# Patient Record
Sex: Female | Born: 1965 | Race: White | Hispanic: No
Health system: Midwestern US, Community
[De-identification: ages and names within clinical notes are randomized; demographics above are authoritative.]

## PROBLEM LIST (undated history)

## (undated) DIAGNOSIS — M5412 Radiculopathy, cervical region: Secondary | ICD-10-CM

## (undated) DIAGNOSIS — B182 Chronic viral hepatitis C: Secondary | ICD-10-CM

## (undated) DIAGNOSIS — Z1211 Encounter for screening for malignant neoplasm of colon: Secondary | ICD-10-CM

## (undated) DIAGNOSIS — R7989 Other specified abnormal findings of blood chemistry: Secondary | ICD-10-CM

## (undated) DIAGNOSIS — M25559 Pain in unspecified hip: Secondary | ICD-10-CM

## (undated) DIAGNOSIS — T7840XA Allergy, unspecified, initial encounter: Secondary | ICD-10-CM

## (undated) DIAGNOSIS — F319 Bipolar disorder, unspecified: Secondary | ICD-10-CM

## (undated) DIAGNOSIS — M1990 Unspecified osteoarthritis, unspecified site: Secondary | ICD-10-CM

## (undated) DIAGNOSIS — F32A Depression, unspecified: Secondary | ICD-10-CM

## (undated) DIAGNOSIS — F329 Major depressive disorder, single episode, unspecified: Secondary | ICD-10-CM

## (undated) DIAGNOSIS — R51 Headache: Secondary | ICD-10-CM

## (undated) DIAGNOSIS — R519 Headache, unspecified: Secondary | ICD-10-CM

## (undated) HISTORY — DX: Headache: R51

## (undated) HISTORY — DX: Unspecified osteoarthritis, unspecified site: M19.90

## (undated) HISTORY — PX: KNEE ARTHROCENTESIS: SUR44

## (undated) HISTORY — DX: Allergy, unspecified, initial encounter: T78.40XA

## (undated) HISTORY — DX: Headache, unspecified: R51.9

## (undated) HISTORY — DX: Pain in unspecified hip: M25.559

## (undated) HISTORY — PX: WRIST ARTHROCENTESIS: SUR48

---

## 2000-10-13 ENCOUNTER — Emergency Department (HOSPITAL_COMMUNITY): Admission: EM | Admit: 2000-10-13 | Discharge: 2000-10-13 | Payer: Self-pay | Admitting: *Deleted

## 2000-10-14 ENCOUNTER — Encounter: Payer: Self-pay | Admitting: Emergency Medicine

## 2000-11-10 ENCOUNTER — Emergency Department (HOSPITAL_COMMUNITY): Admission: EM | Admit: 2000-11-10 | Discharge: 2000-11-10 | Payer: Self-pay | Admitting: Emergency Medicine

## 2004-05-08 ENCOUNTER — Inpatient Hospital Stay: Payer: Self-pay

## 2004-07-30 ENCOUNTER — Other Ambulatory Visit: Payer: Self-pay

## 2004-07-31 ENCOUNTER — Inpatient Hospital Stay: Payer: Self-pay

## 2004-10-29 ENCOUNTER — Emergency Department: Payer: Self-pay | Admitting: Emergency Medicine

## 2005-03-20 ENCOUNTER — Emergency Department: Payer: Self-pay | Admitting: General Practice

## 2010-12-11 ENCOUNTER — Inpatient Hospital Stay
Admit: 2010-12-11 | Discharge: 2010-12-12 | Disposition: A | Discharge status: Home / Self Care | Payer: Self-pay | Attending: Emergency Medicine

## 2010-12-11 LAB — CARDIAC PANEL,(CK, CKMB & TROPONIN)
CK - MB: 0.6 ng/ml (ref 0.5–3.6)
CK - MB: 0.6 ng/ml (ref 0.5–3.6)
CK-MB Index: 0.9 % (ref 0.0–4.0)
CK-MB Index: 1 % (ref 0.0–4.0)
CK: 68 U/L (ref 26–192)
CK: 58 U/L (ref 26–192)
Troponin-I, QT: <0.04 NG/ML (ref 0.00–0.06)
Troponin-I, QT: <0.04 NG/ML (ref 0.00–0.06)

## 2010-12-11 LAB — URINALYSIS W/ RFLX MICROSCOPIC
Bilirubin: NEGATIVE
Blood: NEGATIVE
Glucose: NEGATIVE MG/DL
Ketone: NEGATIVE MG/DL
Leukocyte Esterase: NEGATIVE
Nitrites: NEGATIVE
Protein: NEGATIVE MG/DL
Specific gravity: 1.025 (ref 1.003–1.030)
Urobilinogen: 1 EU/DL (ref 0.2–1.0)
pH (UA): 6 (ref 5.0–8.0)

## 2010-12-11 LAB — CBC WITH AUTOMATED DIFF
ABS. BASOPHILS: 0 10*3/uL (ref 0.0–0.06)
ABS. EOSINOPHILS: 0.3 10*3/uL (ref 0.0–0.4)
ABS. LYMPHOCYTES: 3 10*3/uL (ref 0.9–3.6)
ABS. MONOCYTES: 1.3 10*3/uL — ABNORMAL HIGH (ref 0.05–1.2)
ABS. NEUTROPHILS: 6.4 10*3/uL (ref 1.8–8.0)
BASOPHILS: 0 % (ref 0–2)
EOSINOPHILS: 2 % (ref 0–5)
HCT: 33.8 % — ABNORMAL LOW (ref 35.0–45.0)
HGB: 11.6 g/dL — ABNORMAL LOW (ref 12.0–16.0)
LYMPHOCYTES: 27 % (ref 21–52)
MCH: 31 PG (ref 24.0–34.0)
MCHC: 34.3 g/dL (ref 31.0–37.0)
MCV: 90.4 FL (ref 74.0–97.0)
MONOCYTES: 12 % — ABNORMAL HIGH (ref 3–10)
MPV: 10.2 FL (ref 9.2–11.8)
NEUTROPHILS: 59 % (ref 40–73)
PLATELET: 239 10*3/uL (ref 135–420)
RBC: 3.74 M/uL — ABNORMAL LOW (ref 4.20–5.30)
RDW: 13.6 % (ref 11.6–14.5)
WBC: 11 10*3/uL (ref 4.6–13.2)

## 2010-12-11 LAB — HEPATIC FUNCTION PANEL
A-G Ratio: 0.9 (ref 0.8–1.7)
ALT (SGPT): 58 U/L (ref 30–65)
AST (SGOT): 23 U/L (ref 15–37)
Albumin: 3 g/dL — ABNORMAL LOW (ref 3.4–5.0)
Alk. phosphatase: 65 U/L (ref 50–136)
Bilirubin, direct: 0.1 MG/DL (ref 0.0–0.2)
Bilirubin, total: 0.3 MG/DL (ref 0.2–1.0)
Globulin: 3.2 g/dL (ref 2.0–4.0)
Protein, total: 6.2 g/dL — ABNORMAL LOW (ref 6.4–8.2)

## 2010-12-11 LAB — METABOLIC PANEL, BASIC
Anion gap: 6 mmol/L (ref 3.0–18)
BUN/Creatinine ratio: 22 — ABNORMAL HIGH (ref 12–20)
BUN: 13 MG/DL (ref 7.0–18)
CO2: 25 MMOL/L (ref 21–32)
Calcium: 6.5 MG/DL — ABNORMAL LOW (ref 8.5–10.1)
Chloride: 112 MMOL/L — ABNORMAL HIGH (ref 100–108)
Creatinine: 0.6 MG/DL (ref 0.6–1.3)
GFR est AA: >60 mL/min/{1.73_m2} (ref >60)
GFR est non-AA: >60 mL/min/{1.73_m2} (ref >60)
Glucose: 78 MG/DL (ref 74–99)
Potassium: 2.7 MMOL/L — CL (ref 3.5–5.5)
Sodium: 143 MMOL/L (ref 136–145)

## 2010-12-11 LAB — ETHYL ALCOHOL: ALCOHOL(ETHYL),SERUM: <3 MG/DL (ref 0–3)

## 2010-12-11 LAB — HCG URINE, QL. - POC: Pregnancy test,urine (POC): NEGATIVE

## 2010-12-11 MED ORDER — HYDROCODONE-ACETAMINOPHEN 5 MG-325 MG TAB
5-325 mg | ORAL | Status: AC
Start: 2010-12-11 — End: 2010-12-11
  Administered 2010-12-11: 23:00:00 via ORAL

## 2010-12-11 MED ORDER — POTASSIUM CHLORIDE SR 20 MEQ TAB, PARTICLES/CRYSTALS
20 mEq | ORAL | Status: AC
Start: 2010-12-11 — End: 2010-12-11
  Administered 2010-12-11: 20:00:00 via ORAL

## 2010-12-11 MED ORDER — POTASSIUM CHLORIDE 10 MEQ/100 ML IV PIGGY BACK
10 mEq/0 mL | INTRAVENOUS | Status: AC
Start: 2010-12-11 — End: 2010-12-11
  Administered 2010-12-11: 21:00:00 via INTRAVENOUS

## 2010-12-11 MED ADMIN — morphine injection 4 mg: INTRAVENOUS | @ 20:00:00 | NDC 00409189001

## 2010-12-11 MED ADMIN — sodium chloride 0.9 % bolus infusion 1,000 mL: INTRAVENOUS | @ 19:00:00 | NDC 00409798309

## 2010-12-11 MED ADMIN — morphine injection 2 mg: INTRAVENOUS | @ 19:00:00 | NDC 00409189001

## 2010-12-11 NOTE — ED Notes (Signed)
I have reviewed discharge instructions with the patient.  The patient verbalized understanding.Patient armband removed and shredded

## 2010-12-11 NOTE — ED Notes (Signed)
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  Pt states that she does not have a heart doctor and does not follow up once she is discharged from the hospital.

## 2010-12-11 NOTE — ED Provider Notes (Signed)
HPI Comments: 45 y.o. female presents to the ED C/O facial injuries s/p falling while intoxicated 2 days ago.  She has dental injury, nasal pain and swelling, abrasions to lower lip and nose, abrasion to right forehead.  Denies neck pain.  She states she thinks she passed out.  She also c/o abrasions and bruising bilateral knees.      Went to [facility] clinic yesterday and was advised to come to ED.  She is concerned because she has been feeling tired and thinks she might be having a heart attack.  Denies chest pain.    PMH MI Jan 2012     Patient is a 45 y.o. female presenting with fall. The history is provided by the patient.   Fall  The accident occurred more than 2 days ago. The fall occurred while walking. She fell from a height of ground level. Impact surface: gravel. The volume of blood lost was minimal (abrasions). The point of impact was the left knee, right knee and head. The pain is present in the head, left knee and right knee. The pain is at a severity of 10/10. The pain is severe. She was ambulatory at the scene. There was alcohol use involved in the accident. Associated symptoms include loss of consciousness. Pertinent negatives include no fever, no abdominal pain, no nausea, no vomiting, no headaches and no laceration. The symptoms are aggravated by activity, ambulation, use of injured limb and pressure on injury.    Written by Tressie Ellis , ED Scribe, as dictated by Harlin Rain, MD.     Past Medical History   Diagnosis Date   ??? Depression    ??? Alcohol abuse    ??? MI (myocardial infarction)      x 4.        No past surgical history on file.      No family history on file.     History     Social History   ??? Marital Status: Single     Spouse Name: N/A     Number of Children: N/A   ??? Years of Education: N/A     Occupational History   ??? Not on file.     Social History Main Topics   ??? Smoking status: Not on file   ??? Smokeless tobacco: Not on file   ??? Alcohol Use: 0.0 oz/week     8-14 Cans of beer per week    ??? Drug Use:    ??? Sexually Active:      Other Topics Concern   ??? Not on file     Social History Narrative   ??? No narrative on file                  ALLERGIES: Review of patient's allergies indicates no known allergies.      Review of Systems   Constitutional: Negative for fever and fatigue.   HENT: Positive for facial swelling. Negative for sore throat, rhinorrhea and neck pain.    Eyes: Negative for visual disturbance.   Respiratory: Negative for cough and shortness of breath.    Cardiovascular: Negative for chest pain and palpitations.   Gastrointestinal: Negative for nausea, vomiting, abdominal pain and diarrhea.   Genitourinary: Negative for dysuria and difficulty urinating.   Musculoskeletal: Positive for gait problem. Negative for myalgias and arthralgias.        B knee pain   Skin: Positive for wound. Negative for color change and rash.   Neurological: Positive  for loss of consciousness and syncope. Negative for light-headedness and headaches.   All other systems reviewed and are negative.    Written by Tressie Ellis , ED Scribe, as dictated by Trevor Mace Ahmon Tosi, MD.     Filed Vitals:    12/11/10 1730 12/11/10 1736 12/11/10 1845 12/11/10 2007   BP: 106/60   105/51   Pulse:  87 85 90   Temp:       Resp:  23 16 18    Height:       Weight:       SpO2:    98%            Physical Exam   Nursing note and vitals reviewed.  Constitutional: She is oriented to person, place, and time. She appears well-developed and well-nourished.   HENT:   Head: Normocephalic. Head is with abrasion and with contusion.       Right Ear: External ear normal.   Left Ear: External ear normal.   Nose: Nose normal. No nasal septal hematoma.   Mouth/Throat: Oropharynx is clear and moist. Abnormal dentition.        Mid face stable  Tooth #7 chipped  Abrasions left forehead, tip of nose and lower lip   Eyes: Conjunctivae and EOM are normal. Pupils are equal, round, and reactive to light.    Neck: Normal range of motion. Neck supple. No JVD present. No tracheal deviation present.   Cardiovascular: Normal rate, regular rhythm, normal heart sounds and intact distal pulses.  Exam reveals no gallop and no friction rub.    No murmur heard.  Pulmonary/Chest: Effort normal and breath sounds normal. No respiratory distress. She has no wheezes. She has no rales.   Abdominal: Soft. Bowel sounds are normal. She exhibits no distension and no mass. There is no tenderness. There is no rebound and no guarding.   Musculoskeletal: Normal range of motion. She exhibits no edema and no tenderness.        Right knee: She exhibits normal range of motion. tenderness found.        Left knee: She exhibits normal range of motion. tenderness ( inferior to patella) found.        Abrasions B knees   Neurological: She is alert and oriented to person, place, and time. She has normal reflexes. No cranial nerve deficit.   Skin: Skin is warm and dry. Abrasion noted. No laceration and no rash noted.        See HENT & musculoskeletal   Psychiatric: She has a normal mood and affect. Her behavior is normal.    Written by Tressie Ellis , ED Scribe, as dictated by Harlin Rain, MD.     MDM     Differential Diagnosis; Clinical Impression; Plan:     DDx:  ICH, fractures, soft tissue injury, ACS, intoxication, dehydration, metabolic abnormality  Amount and/or Complexity of Data Reviewed:   Clinical lab tests:  Ordered and reviewed  Tests in the radiology section of CPT??:  Ordered and reviewed (CT maxillofacial; XR bilat knees; CT head)  Tests in the medicine section of the CPT??:  Ordered and reviewed (EKG)   Review and summarize past medical records:  Yes   Independant visualization of image, tracing, or specimen:  Yes (CT maxillofacial; XR bilat knees; CT head)    EKG FINDING:  Normal Sinus Rhythm; rate: 86; Read by Trevor Mace Brylin Stopper MD at 14:40  Written by Tressie Ellis , ED Scribe, as dictated by Trevor Mace  Nhyira Leano, MD.      XR KNEES BI MIN 4 V (Final result)   Result time:12/11/10 1412      Final result by Rad Results In Edi (12/11/10 14:12:13)      Impression:    Impression:    Negative bilateral knee series      Narrative:    Indication: Pain after fall    Findings: Bilateral knee series, 10 views, including bilateral sunrise views.    No fracture. No lytic or blastic changes. No joint deformity or joint effusion.       CT MAXILLOFACIAL WO CONT (Final result)   Result time:12/11/10 1712      Final result by Rad Results In Edi (12/11/10 17:12:50)      Impression:    Impression:    1. Comminuted, mildly displaced bilateral nasal bone fractures with involvement  of the anterior superior aspect of the nasal septum, as described above.      Narrative:    CT scan of the facial bones without contrast 12/11/2010.    History: Syncope. Perinasal and periorbital soft tissue swelling.    Technique: Serial axial images were obtained from the mentum of the mandible  through the supraorbital ridge without contrast. Sagittal and coronal  reformations were obtained from the source images.    Findings: Mildly comminuted, minimally displaced bilateral nasal bone fractures  are noted. The fracture involves the anterior superior aspect of the nasal  septum which is slightly displaced to the right. The nasomaxillary spine  remains intact. Bone alignment and articulation are otherwise within normal  limits. Specifically, no additional displaced fractures are identified. The  visualized joint spaces are well-maintained. The globes and orbits appear  intact.    Extensive paranasal soft tissue swelling is noted, extending into the malar  regions and also within the periorbital distribution.                    CT HEAD WO CONT (Final result)   Result time:12/11/10 1704      Final result by Rad Results In Edi (12/11/10 17:04:31)      Impression:    IMPRESSION:  1. Unremarkable noncontrast head CT.  2. Recommend MRI as clinically warranted for further evaluation.       Narrative:    CT scan of the head dated 12/11/2010.    HISTORY: Syncope.    TECHNIQUE: Serial axial images were obtained from the foramen magnum to the  skull vertex without IV contrast. No prior studies are available for  comparison.    FINDINGS: The sulci, ventricles, and basal cisterns are within normal limits  for age. There is no intracranial hemorrhage, mass-effect, or midline shift. No  extra-axial fluid collections are identified. There are no significant areas of  abnormal parenchymal attenuation.    The visualized portions of the paranasal sinuses and mastoid air cells are  clear. The visualized bones are intact.           Labs Reviewed   METABOLIC PANEL, BASIC - Abnormal; Notable for the following:     Potassium 2.7 (*)      Chloride 112 (*)      BUN/Creatinine ratio 22 (*)      Calcium 6.5 (*)      All other components within normal limits   CBC WITH AUTOMATED DIFF - Abnormal; Notable for the following:     RBC 3.74 (*)      HGB 11.6 (*)      HCT  33.8 (*)      MONOCYTES 12 (*)      ABS. MONOCYTES 1.3 (*)      All other components within normal limits   HEPATIC FUNCTION PANEL - Abnormal; Notable for the following:     Protein, total 6.2 (*)      Albumin 3.0 (*)      All other components within normal limits   URINALYSIS W/ RFLX MICROSCOPIC   CARDIAC PANEL,(CK, CKMB & TROPONIN)   ETHYL ALCOHOL   HCG URINE, QL. - POC   CARDIAC PANEL,(CK, CKMB & TROPONIN)   POC URINE PREGNANCY TEST       Recent Results (from the past 24 hour(s))   URINALYSIS W/ RFLX MICROSCOPIC    Collection Time    12/11/10 12:30 PM       Component Value Range    Color YELLOW      Appearance CLEAR      Specific gravity 1.025  1.003 - 1.030      pH 6.0  5.0 - 8.0      Protein NEGATIVE   NEGATIVE MG/DL    Glucose NEGATIVE   NEGATIVE MG/DL    Ketone NEGATIVE   NEGATIVE MG/DL    Bilirubin NEGATIVE   NEGATIVE    Blood NEGATIVE   NEGATIVE    Urobilinogen 1.0  0.2 - 1.0 EU/DL    Nitrites NEGATIVE   NEGATIVE     Leukocyte Esterase NEGATIVE   NEGATIVE   EKG, 12 LEAD, INITIAL    Collection Time    12/11/10  2:34 PM       Component Value Range    Ventricular Rate 86      Atrial Rate 86      P-R Interval 124      QRS Duration 66      Q-T Interval 390      QTC Calculation (Bezet) 466      Calculated P Axis 55      Calculated R Axis -39      Calculated T Axis 33      Diagnosis        Value: Normal sinus rhythm      Left axis deviation      Abnormal ECG      No previous ECGs available   METABOLIC PANEL, BASIC    Collection Time    12/11/10  2:35 PM       Component Value Range    Sodium 143  136 - 145 MMOL/L    Potassium 2.7 (*) 3.5 - 5.5 MMOL/L    Chloride 112 (*) 100 - 108 MMOL/L    CO2 25  21 - 32 MMOL/L    Anion gap 6  3.0 - 18 mmol/L    Glucose 78  74 - 99 MG/DL    BUN 13  7.0 - 18 MG/DL    Creatinine 0.6  0.6 - 1.3 MG/DL    BUN/Creatinine ratio 22 (*) 12 - 20      GFR est AA >60  >60 ml/min/1.28m2    GFR est non-AA >60  >60 ml/min/1.53m2    Calcium 6.5 (*) 8.5 - 10.1 MG/DL   CBC WITH AUTOMATED DIFF    Collection Time    12/11/10  2:35 PM       Component Value Range    WBC 11.0  4.6 - 13.2 K/uL    RBC 3.74 (*) 4.20 - 5.30 M/uL    HGB 11.6 (*) 12.0 - 16.0 g/dL  HCT 33.8 (*) 35.0 - 45.0 %    MCV 90.4  74.0 - 97.0 FL    MCH 31.0  24.0 - 34.0 PG    MCHC 34.3  31.0 - 37.0 g/dL    RDW 08.6  57.8 - 46.9 %    PLATELET 239  135 - 420 K/uL    MPV 10.2  9.2 - 11.8 FL    NEUTROPHILS 59  40 - 73 %    LYMPHOCYTES 27  21 - 52 %    MONOCYTES 12 (*) 3 - 10 %    EOSINOPHILS 2  0 - 5 %    BASOPHILS 0  0 - 2 %    ABS. NEUTROPHILS 6.4  1.8 - 8.0 K/UL    ABS. LYMPHOCYTES 3.0  0.9 - 3.6 K/UL    ABS. MONOCYTES 1.3 (*) 0.05 - 1.2 K/UL    ABS. EOSINOPHILS 0.3  0.0 - 0.4 K/UL    ABS. BASOPHILS 0.0  0.0 - 0.06 K/UL    DF AUTOMATED     HCG URINE, QL. - POC    Collection Time    12/11/10  2:53 PM       Component Value Range    Pregnancy test,urine (POC) NEGATIVE   NEGATIVE   CARDIAC PANEL,(CK, CKMB & TROPONIN)    Collection Time    12/11/10  3:00 PM        Component Value Range    CK 68  26 - 192 U/L    CK - MB 0.6  0.5 - 3.6 ng/ml    CK-MB Index 0.9  0.0 - 4.0 %    Troponin-I, Qt. <0.04  0.00 - 0.06 NG/ML   ETHYL ALCOHOL    Collection Time    12/11/10  3:00 PM       Component Value Range    ALCOHOL(ETHYL),SERUM <3  0 - 3 MG/DL   HEPATIC FUNCTION PANEL    Collection Time    12/11/10  6:25 PM       Component Value Range    Protein, total 6.2 (*) 6.4 - 8.2 g/dL    Albumin 3.0 (*) 3.4 - 5.0 g/dL    Globulin 3.2  2.0 - 4.0 g/dL    A-G Ratio 0.9  0.8 - 1.7      Bilirubin, total 0.3  0.2 - 1.0 MG/DL    Bilirubin, direct 0.1  0.0 - 0.2 MG/DL    Alk. phosphatase 65  50 - 136 U/L    AST 23  15 - 37 U/L    ALT 58  30 - 65 U/L   CARDIAC PANEL,(CK, CKMB & TROPONIN)    Collection Time    12/11/10  6:25 PM       Component Value Range    CK 58  26 - 192 U/L    CK - MB 0.6  0.5 - 3.6 ng/ml    CK-MB Index 1.0  0.0 - 4.0 %    Troponin-I, Qt. <0.04  0.00 - 0.06 NG/ML     5:40 PM  Patients results have been reviewed with them.  Patient and/or family have verbally conveyed their understanding and agreement of the patient's signs, symptoms, diagnosis, treatment and prognosis and additionally agree to follow up as recommended or return to the Emergency Room should their condition change prior to their follow-up appointment. Patient verbally agrees with the care-plan and verbally conveys that all of their questions have been answered.   Discharge instructions have also been  provided to the patient with some educational information regarding their diagnosis as well a list of reasons why they would want to return to the ER prior to their follow-up appointment should their condition change.      Procedures      8:17 PM   Patients results have been reviewed with them.  Patient and/or family have verbally conveyed their understanding and agreement of the patient's signs, symptoms, diagnosis, treatment and prognosis and additionally agree to follow up as recommended or return to the Emergency Room should their condition change prior to their follow-up appointment. Patient verbally agrees with the care-plan and verbally conveys that all of their questions have been answered.   Discharge instructions have also been provided to the patient with some educational information regarding their diagnosis as well a list of reasons why they would want to return to the ER prior to their follow-up appointment should their condition change.     CLINICAL IMPRESSION    1. Hypokalemia    2. Nasal fracture    3. Multiple contusions    4. Abrasions of multiple sites

## 2010-12-11 NOTE — ED Notes (Signed)
Pt with nasal swelling, bilat periorbital swelling, abrasion to her lip and both knees, and nose.  Pt states that she chipped both of her top front teeth.  Pt went to [facility] yesterday and was told to go to the emergency room, but went home instead to go to bed.

## 2010-12-11 NOTE — ED Notes (Signed)
Verbal shift change report given to Jenny Hall, RN by Mary Souza, RN.  Report given with ED Summary.

## 2010-12-11 NOTE — ED Notes (Signed)
PT states that she fell.  She states that she blacked out.  PT states that she fell 3 times in a row.  Pt states that she has had a heart attack and she doesn't know if she had another.  PT states that she has been very tired.  Pt states that she was drinking heavily when she passed out.  Pt states that she had consumed 8 beers.

## 2010-12-11 NOTE — ED Notes (Signed)
Pt resting quietly in bed watching TV with visitor at bedside. VSS. Pt provided pain medication at this time, waiting for lab results prior to probable discharge home, verbalized understanding.

## 2010-12-12 LAB — EKG, 12 LEAD, INITIAL
Atrial Rate: 86 {beats}/min
Calculated P Axis: 55 degrees
Calculated R Axis: -39 degrees
Calculated T Axis: 33 degrees
Diagnosis: NORMAL
P-R Interval: 124 ms
Q-T Interval: 390 ms
QRS Duration: 66 ms
QTC Calculation (Bezet): 466 ms
Ventricular Rate: 86 {beats}/min

## 2010-12-12 MED ORDER — HYDROCODONE-ACETAMINOPHEN 5 MG-325 MG TAB
5-325 mg | ORAL_TABLET | ORAL | Status: DC | PRN
Start: 2010-12-12 — End: 2014-04-08

## 2013-12-04 LAB — AMB EXT CREATININE: Creatinine, External: 0.87

## 2013-12-04 LAB — AMB EXT LDL-C: LDL-C, External: 72

## 2014-02-12 ENCOUNTER — Inpatient Hospital Stay: Admit: 2014-02-12 | Payer: MEDICAID | Primary: Family Medicine

## 2014-02-12 ENCOUNTER — Ambulatory Visit
Admit: 2014-02-12 | Discharge: 2014-02-12 | Payer: PRIVATE HEALTH INSURANCE | Attending: Gastroenterology | Primary: Family Medicine

## 2014-02-12 ENCOUNTER — Encounter

## 2014-02-12 DIAGNOSIS — B182 Chronic viral hepatitis C: Secondary | ICD-10-CM

## 2014-02-12 NOTE — Progress Notes (Signed)
[facility]  [facility]       Bryson Ha, MD, FACP, [facility]     April Carollee Massed, NP     Kennon Portela, PA-C      Bryson Ha, MD, FACP, [facility]     Gillian Scarce, MD     Amy Genene Churn, NP     Lillia Pauls, NP        [facility]     [address], [address]     [place], [place]  [address]     [phone]     FAX: [phone]    [facility]     [address], [address]     [place], [place]  [address]     [phone]     FAX: [phone]         Patient Care Team:  Milus Glazier, DO as PCP - General (Family Practice)  None      Problem List  Date Reviewed: Mar 10, 2014        Codes Class Noted    Chronic hepatitis C (HCC) ICD-10-CM:  B18.2  ICD-9-CM:  070.54  03-10-14        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  All medical records sent by the referring physicians were reviewed     The patient is a 48 y.o. Caucasian female who was noted to have abnormalities in liver chemistries and subsequently tested positive for chronic HCV in 2002.      Risk factors for acquiring HCV are IV drug use in 1984 - 2003.  There was no history of acute incteric hepatitis at the time of these risk factors.      Imaging of the liver was not performed.      A liver biopsy has not been performed.        The patient has never received treatment for chronic HCV.      The most recent laboratory studies indicate that the liver transaminases are normal, alkaline phosphatase is normal, tests of hepatic synthetic and metabolic function are normal,     The patient has no symptoms which can be attributed to the liver disorder.    The patient has not experienced fatigue, pain in the right side over the  liver, problems concentrating, swelling of the abdomen, swelling of the lower extremities, hematemesis, hematochezia.    The patient completes all daily activities without any functional limitations.      ALLERGIES  No Known Allergies    MEDICATIONS  Current Outpatient Prescriptions   Medication Sig   ??? BUPROPION HCL (WELLBUTRIN PO) Take  by mouth.     ??? CITALOPRAM HYDROBROMIDE (CELEXA PO) Take  by mouth.     ??? HYDROcodone-acetaminophen (NORCO) 5-325 mg per tablet Take 1 Tab by mouth every four (4) hours as needed for Pain.     No current facility-administered medications for this visit.       SYSTEM REVIEW NOT RELATED TO LIVER DISEASE OR REVIEWED ABOVE:  Constitution systems: Negative for fever, chills, weight gain, weight  loss.   Eyes: Negative for visual changes.  ENT: Negative for sore throat, painful swallowing.   Respiratory: Negative for cough, hemoptysis, SOB.   Cardiology: Negative for chest pain, palpitations.  GI:  Negative for constipation or diarrhea.  GU: Negative for urinary frequency, dysuria, hematuria, nocturia.   Skin: Negative for rash.  Hematology: Negative for easy bruising, blood clots.    Musculo-skelatal: Negative for back pain, muscle pain, weakness.  Neurologic: Negative for headaches, dizziness, vertigo, memory problems not related to HE.  Psychology: Negative for anxiety, depression.       FAMILY HISTORY:  The patient has no knowledge of the father's medical condition.    The mother died of lung cancer.    There is no family history of liver disease.      SOCIAL HISTORY:  The patient is separated.     The spouse has been tested for HCV and is negative.  The patient has 1 child.    The patient currently smokes half pack of tobacco daily.    The patient has never consumed significant amounts of alcohol.    The patient currently works full time lawn care.        PHYSICAL EXAMINATION:  BP 107/76 mmHg   Pulse 90   Temp(Src) 97.6 ??F (36.4 ??C) (Tympanic)   Resp  16   Ht 5\' 10"  (1.778 m)   Wt 156 lb (70.761 kg)   BMI 22.38 kg/m2   SpO2 97%  General: No acute distress.   Eyes: Sclera anicteric.   ENT: No oral lesions.  Thyroid normal.  Nodes: No adenopathy.   Skin: No spider angiomata.  No jaundice.  No palmar erythema.  Respiratory: Lungs clear to auscultation.   Cardiovascular: Regular heart rate.  No murmurs.  No JVD.  Abdomen: Soft non-tender.  Liver size normal to percussion/palpation.  Spleen not palpable. No obvious ascites.  Extremities: No edema.  No muscle wasting.  No gross arthritic changes.  Neurologic: Alert and oriented.  Cranial nerves grossly intact.  No asterixis.    LABORATORY STUDIES:  From 12/2013  AST/ALT/ALP/T Bili/ALB:  40/41/47/0.2/4.1    SEROLOGIES:  12/2013.  HCV RNA Log 6.0 intU    LIVER HISTOLOGY:  Not available or performed    ENDOSCOPIC PROCEDURES:  Not available or performed    RADIOLOGY:  Not available or performed    OTHER TESTING:  Not available or performed    ASSESSMENT AND PLAN:  Chronic HCV of unclear severity.      Liver function is normal.      Will perform laboratory testing to monitor liver function and degree of liver injury.  This included BMP, hepatic panel, CBC with platelet count,     Will perform and/or review results of HCV viral load and HCV genotype to define the specific treatment and duration of treatment that will be required.      Will perform serologic and virologic studies to assess for other causes of chronic liver disease.      Will perform imaging of the liver with ultrasound.      The need to perform an assessment of liver fibrosis was discussed with the patient.  Liver biopsy has been used to assess the degree of fibrosis and the need for HCV treatment.  The risks of performing the liver biopsy including pain, puncture of the lung, gallbladder, intestine or kidney and bleeding were discussed.      The Fibroscan can assess liver fibrosis and determine if a patient has  advanced fibrosis or cirrhosis without the need for liver biopsy.  The Fibroscan is currently available at [facility].  This will be scheduled.    The patient has not previously been treated for HCV.      Discussed the treatment alternatives.  The SVR/cure rate for HCV now exceeds 90% with just oral anti-viral therapy and no interferon injections or significant side effects for most patients with HCV.  The specific treatment is dependent upon genotype, viral load and histology.      Unfortunately most insurance carriers will only provide HCV medication for patients with advanced fibrosis or cirrhosis.  We will request pre-authorization for the HCV medication but approval will be subject to guidelines established by the patient's insurance carrier.  If the patient is denied we may appeal on their behalf.      The patient was directed to continue all current medications at the current dosages.  There are no contraindications for the patient to take any medications that are necessary for treatment of other medical issues.    The patient was counseled regarding alcohol consumption.      The need for vaccination against viral hepatitis A and B will be assessed with serologic and instituted as appropriate.    All of the above issues were discussed with the patient.  All questions were answered.  The patient expressed a clear understanding of the above.    Follow-up [facility] in for Fibroscan, and then to initiate HCV treatment.    Bryson HaMitchell L Laquanna Veazey, MD  [facility]    [id number]  [facility], [address]  [place], [place]  [id number]  [phone]

## 2014-02-17 LAB — CBC WITH AUTOMATED DIFF
ABS. BASOPHILS: 0 10*3/uL (ref 0.0–0.2)
ABS. EOSINOPHILS: 0.3 10*3/uL (ref 0.0–0.4)
ABS. IMM. GRANS.: 0 10*3/uL (ref 0.0–0.1)
ABS. MONOCYTES: 0.5 10*3/uL (ref 0.1–0.9)
ABS. NEUTROPHILS: 3.5 10*3/uL (ref 1.4–7.0)
Abs Lymphocytes: 2.1 10*3/uL (ref 0.7–3.1)
BASOPHILS: 1 %
EOSINOPHILS: 4 %
HCT: 38.7 % (ref 34.0–46.6)
HGB: 12.3 g/dL (ref 11.1–15.9)
IMMATURE GRANULOCYTES: 0 %
Lymphocytes: 33 %
MCH: 29.4 pg (ref 26.6–33.0)
MCHC: 31.8 g/dL (ref 31.5–35.7)
MCV: 92 fL (ref 79–97)
MONOCYTES: 8 %
NEUTROPHILS: 54 %
PLATELET: 257 10*3/uL (ref 150–379)
RBC: 4.19 x10E6/uL (ref 3.77–5.28)
RDW: 14.8 % (ref 12.3–15.4)
WBC: 6.4 10*3/uL (ref 3.4–10.8)

## 2014-02-17 LAB — METABOLIC PANEL, BASIC
BUN/Creatinine ratio: 19 (ref 9–23)
BUN: 18 mg/dL (ref 6–24)
CO2: 25 mmol/L (ref 18–29)
Calcium: 9 mg/dL (ref 8.7–10.2)
Chloride: 104 mmol/L (ref 97–108)
Creatinine: 0.95 mg/dL (ref 0.57–1.00)
GFR est AA: 81 mL/min/{1.73_m2} (ref >59)
GFR est non-AA: 70 mL/min/{1.73_m2} (ref >59)
Glucose: 82 mg/dL (ref 65–99)
Potassium: 4.5 mmol/L (ref 3.5–5.2)
Sodium: 143 mmol/L (ref 134–144)

## 2014-02-17 LAB — HEPATIC FUNCTION PANEL
ALT (SGPT): 66 IU/L — ABNORMAL HIGH (ref 0–32)
AST (SGOT): 46 IU/L — ABNORMAL HIGH (ref 0–40)
Albumin: 4.4 g/dL (ref 3.5–5.5)
Alk. phosphatase: 53 IU/L (ref 39–117)
Bilirubin, direct: 0.08 mg/dL (ref 0.00–0.40)
Bilirubin, total: <0.2 mg/dL (ref 0.0–1.2)
Protein, total: 7.1 g/dL (ref 6.0–8.5)

## 2014-02-17 LAB — HCV REAL-TIME, PCR, QUANT
Hepatitis C Quantitation: 515560 IU/mL
Hepatitis C log 10: 5.712 log10 IU/mL

## 2014-02-17 LAB — HEP C GENOTYPE

## 2014-02-17 LAB — IRON PROFILE
Iron % saturation: 10 % — ABNORMAL LOW (ref 15–55)
Iron: 36 ug/dL (ref 35–155)
TIBC: 354 ug/dL (ref 250–450)
UIBC: 318 ug/dL (ref 150–375)

## 2014-02-17 LAB — HCV RNA BY NAA QL,RFLX TO QT: HCV RNA by NAA, QL: POSITIVE — AB

## 2014-02-17 LAB — HEP B SURFACE AG: Hep B surface Ag screen: NEGATIVE

## 2014-02-17 LAB — HEPATITIS B CORE AB, TOTAL: Hep B Core Ab, total: NEGATIVE

## 2014-02-17 LAB — HEP A AB, TOTAL: Hep A Ab, total: NEGATIVE

## 2014-02-17 LAB — HEP B SURFACE AB: HEP B SURFACE AB, QUAL: NONREACTIVE

## 2014-02-17 LAB — FERRITIN: Ferritin: 90 ng/mL (ref 15–150)

## 2014-02-17 LAB — HEPATITIS B CORE ANTIBODY, TOTAL: Hep B Core Total Ab: NEGATIVE

## 2014-02-17 LAB — HEPATITIS B SURFACE ANTIBODY: HEP B SURFACE AB, QUAL, 006408: NONREACTIVE

## 2014-03-31 ENCOUNTER — Inpatient Hospital Stay: Admit: 2014-03-31 | Payer: MEDICAID | Attending: Gastroenterology | Primary: Family Medicine

## 2014-03-31 DIAGNOSIS — B182 Chronic viral hepatitis C: Secondary | ICD-10-CM

## 2014-04-07 ENCOUNTER — Ambulatory Visit
Admit: 2014-04-07 | Discharge: 2014-04-07 | Payer: PRIVATE HEALTH INSURANCE | Attending: Physician Assistant | Primary: Family Medicine

## 2014-04-07 DIAGNOSIS — B182 Chronic viral hepatitis C: Secondary | ICD-10-CM

## 2014-04-07 NOTE — [place] Notes (Signed)
[place], MD, FACP, [facility]     April G Long, NP     Charlcie Cradle, PA-C      Thedore Mins, MD, [place], [facility]     Marcy Siren, MD     [place], NP     Jaclynn Major, NP        [facility]     [address] [place]  [address]     [phone]     FAX: [facility]     [address] [place]  [address]     [phone]     FAX: [phone]     Patient Care Team:  Rema Fendt, DO as PCP - General (Family Practice)  None      Problem List  Date Reviewed: Apr 22, 2014          Codes Class Noted    Chronic hepatitis C ([place]) ICD-10-CM: B18.2  ICD-9-CM: 070.54  02/12/2014        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  The active problem list, all pertinent past medical history, medications, radiologic findings and laboratory findings related to the liver disorder were reviewed with the patient.      The patient is a 49 y.o. Caucasian female who was noted to have abnormalities in liver chemistries and subsequently tested positive for chronic HCV in 2002.  Risk factors for acquiring HCV are IV drug use in 1984 - 2003.  There was no history of acute incteric hepatitis at the time of these risk factors.      Imaging of the liver was recently performed and shows 2.9cm rounded mass in the posterior right lobe, likely representing hemangioma.    A liver biopsy has not been performed.    She presents instead for fibroscan analysis today.    The patient has never received treatment for chronic HCV.      The most recent laboratory studies indicate that the liver transaminases  are normal, alkaline phosphatase is normal, tests of hepatic synthetic and metabolic function are normal.    The patient has no symptoms which can be attributed to the liver disorder.  The patient has not experienced fatigue, pain in the right side over the liver, problems concentrating, swelling of the abdomen, swelling of the lower extremities, hematemesis, hematochezia. The patient completes all daily activities without any functional limitations.    ALLERGIES  No Known Allergies    MEDICATIONS  Current Outpatient Prescriptions   Medication Sig   ??? methylPREDNISolone (MEDROL DOSEPACK) 4 mg tablet Take  by mouth.   ??? fluticasone (FLONASE) 50 mcg/actuation nasal spray nightly.   ??? HYDROcodone-acetaminophen (NORCO) 5-325 mg per tablet Take 1 Tab by mouth every four (4) hours as needed for Pain.   ??? BUPROPION HCL (WELLBUTRIN PO) Take  by mouth.     ??? CITALOPRAM HYDROBROMIDE (CELEXA PO) Take  by mouth.  No current facility-administered medications for this visit.       SYSTEM REVIEW NOT RELATED TO LIVER DISEASE OR REVIEWED ABOVE:  Constitution systems: Negative for fever, chills, weight gain, weight loss.   Eyes: Negative for visual changes.  ENT: Negative for sore throat, painful swallowing.   Respiratory: Negative for cough, hemoptysis, SOB.   Cardiology: Negative for chest pain, palpitations.  GI:  Negative for constipation or diarrhea.  GU: Negative for urinary frequency, dysuria, hematuria, nocturia.   Skin: Negative for rash.  Hematology: Negative for easy bruising, blood clots.    Musculo-skelatal: Negative for back pain, muscle pain, weakness.  Neurologic: Negative for headaches, dizziness, vertigo, memory problems not related to HE.  Psychology: Negative for anxiety, depression.     FAMILY HISTORY:  The patient has no knowledge of the father's medical condition.    The mother died of lung cancer.    There is no family history of liver disease.      SOCIAL HISTORY:  The patient is separated.      The spouse has been tested for HCV and is negative.  The patient has 1 child.    The patient currently smokes half pack of tobacco daily.    The patient has never consumed significant amounts of alcohol.    The patient currently works full time lawn care.      PHYSICAL EXAMINATION:  BP 120/79 mmHg   Pulse 77   Temp(Src) 96.6 ??F (35.9 ??C) (Oral)   Ht '5\' 10"'  (1.778 m)   Wt 153 lb (69.4 kg)   BMI 21.95 kg/m2   SpO2 98%  General: No acute distress.   Eyes: Sclera anicteric.   ENT: No oral lesions.  Thyroid normal.  Nodes: No adenopathy.   Skin: No spider angiomata.  No jaundice.  No palmar erythema.  Respiratory: Lungs clear to auscultation.   Cardiovascular: Regular heart rate.  No murmurs.  No JVD.  Abdomen: Soft non-tender.  Liver size normal to percussion/palpation.  Spleen not palpable. No obvious ascites.  Extremities: No edema.  No muscle wasting.  No gross arthritic changes.  Neurologic: Alert and oriented.  Cranial nerves grossly intact.  No asterixis.    LABORATORY STUDIES:  [facility] Ref Rng 02/12/2014 12/11/2010   WBC 3.4 - 10.8 x10E3/uL 6.4 11.0   ANC 1.4 - 7.0 x10E3/uL 3.5 6.4   HGB 11.1 - 15.9 g/dL 12.3 11.6 (L)   PLT 150 - 379 x10E3/uL 257 239   AST 0 - 40 IU/L 46 (H) 23   ALT 0 - 32 IU/L 66 (H) 58   Alk Phos 39 - 117 IU/L 53 65   Bili, Total 0.0 - 1.2 mg/dL <0.2 0.3   Bili, Direct 0.00 - 0.40 mg/dL 0.08 0.1   Albumin 3.5 - 5.5 g/dL 4.4 3.0 (L)   BUN 6 - 24 mg/dL 18 13   Creat 0.57 - 1.00 mg/dL 0.95 0.6   Na 134 - 144 mmol/L 143 143   K 3.5 - 5.2 mmol/L 4.5 2.7 (LL)   Cl 97 - 108 mmol/L 104 112 (H)   CO2 18 - 29 mmol/L 25 25   Glucose 65 - 99 mg/dL 82 78     SEROLOGIES:  Serologies Latest Ref Rng 02/12/2014   Hep A Ab, Total Negative Negative   Hep B Surface Ag Negative Negative   Hep B Core Ab, Total Negative Negative   Hep B Surface AB QL  Non Reactive   Hep  C Genotype  1a   HCV RT-PCR, Quant  [id number]   Ferritin 15 - 150 ng/mL 90   Iron % Saturation 15 - 55 % 10 (L)      LIVER HISTOLOGY:  04/2014.  FibroScan performed at [facility]. EkPa was 3.7.  Suggested fibrosis level is F0-1.    ENDOSCOPIC PROCEDURES:  Not available or performed    RADIOLOGY:  03/2014.  Ultrasound of liver.  Echogenic consistent with chronic liver disease.  2.9 cm rounded liver mass, likely hemangioma.  No dilated bile ducts.  No ascites.    OTHER TESTING:  Not available or performed    ASSESSMENT AND PLAN:  Chronic HCV in the setting of no to minimal fibrosis as assessed with fibroscan.    Liver function is normal.      Serologic and virologic studies to assess for other causes of chronic liver disease were negative.    The patient has genotype 1a infection.    The patient has not previously been treated for HCV.      We briefly discussed the treatment alternatives.  The SVR/cure rate for HCV now exceeds 90% with just oral anti-viral therapy and no interferon injections or significant side effects for most patients with HCV.  The specific treatment is dependent upon genotype, viral load and histology.  Given this patient's characteristics, would recommend that she receive treatment for 8 weeks with once daily Harvoni.  Unfortunately most insurance carriers will only provide HCV medication for patients with advanced fibrosis or cirrhosis.  We will request pre-authorization for the HCV medication but approval will be subject to guidelines established by the patient's insurance carrier.  If the patient is denied we may appeal on their behalf.  This will be discussed further at her follow-up with our [facility] office tomorrow.    New possible liver mass on recent screening ultrasound. Finding is likely representing a hemangioma and I have reviewed this at length with the patient.  She understands that she will need CT for further evaluation and that this will be set up through [facility] office.    The patient was directed to continue all current medications at the  current dosages.  There are no contraindications for the patient to take any medications that are necessary for treatment of other medical issues.    The patient was counseled regarding alcohol consumption.      Vaccination for viral hepatitis A and B is recommended since the patient has no serologic evidence of previous exposure or vaccination with immunity.    All of the above issues were discussed with the patient.  All questions were answered.  The patient expressed a clear understanding of the above.    Follow-up [facility] with Telford Nab, NP at the [facility] office.    Charlcie Cradle, PA-C  [facility] [place], [place]  [address]  [phone]

## 2014-04-07 NOTE — Progress Notes (Signed)
Chief Complaint   Patient presents with   ??? Follow-up     Fibroscan

## 2014-04-08 ENCOUNTER — Ambulatory Visit
Admit: 2014-04-08 | Discharge: 2014-04-08 | Payer: PRIVATE HEALTH INSURANCE | Attending: Family | Primary: Family Medicine

## 2014-04-08 DIAGNOSIS — B182 Chronic viral hepatitis C: Secondary | ICD-10-CM

## 2014-04-08 NOTE — [place] Notes (Signed)
[place], MD, FACP, [facility]     Brittney G Long, NP     Charlcie Cradle, PA-C      Thedore Mins, MD, [place], [facility]     Marcy Siren, MD     [place], NP     Jaclynn Major, NP        [facility]     [address], [place]  [address]     [phone]     FAX: [facility]     [address] [place]  [address]     [phone]     FAX: [phone]     Patient Care Team:  Rema Fendt, DO as PCP - General (Family Practice)  None      Problem List  Date Reviewed: April 14, 2014          Codes Class Noted    Chronic hepatitis C [facility]) ICD-10-CM: B18.2  ICD-9-CM: 070.54  02/12/2014        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   The active problem list, all pertinent past medical history, medications, radiologic findings and laboratory findings related to the liver disorder were reviewed with the patient.      The patient is a 49 y.o. Caucasian female who was noted to have abnormalities in liver chemistries and subsequently tested positive for chronic HCV in 2002.  Risk factors for acquiring HCV are IV drug use in 1984 - 2003.  There was no history of acute incteric hepatitis at the time of these risk factors.      Imaging of the liver was recently performed and shows 2.9 cm rounded mass in the posterior right lobe, likely representing hemangioma.    A liver biopsy has not been performed.    Fibroscan analysis was performed on 04/07/2014.  This suggests minimal fibrosis, F0-1.     The patient has never received treatment for chronic HCV.      The most recent laboratory studies indicate that the liver transaminases  are normal, alkaline phosphatase is normal, tests of hepatic synthetic and metabolic function are normal.    The patient has no complaints which can be attributed to liver disease.    The patient denies fatigue. The patient completes all daily activities without any functional limitations. The patient has not experienced fevers, chills, shortness of breath, chest pain, pain in the right side over the liver, diffuse abdominal pain, nausea, vomiting, constipation, diarrhrea, dry eyes, dry mouth, arthralgias, myalgias, yellowing of the eyes or skin, itching, dark urine, problems concentrating, swelling of the abdomen, swelling of the lower extremities, hematemesis, or hematochezia.      ALLERGIES  No Known Allergies    MEDICATIONS  Current Outpatient Prescriptions   Medication Sig   ??? traMADol (ULTRAM) 50 mg tablet Take 50 mg by mouth every six (6) hours as needed for Pain.   ??? fluticasone (FLONASE) 50 mcg/actuation nasal spray nightly.  No current facility-administered medications for this visit.       SYSTEM REVIEW NOT RELATED TO LIVER DISEASE OR REVIEWED ABOVE:  Constitution systems: Negative for fever, chills, weight gain, weight loss.   Eyes: Negative for visual changes.  ENT: Negative for sore throat, painful swallowing.   Respiratory: Negative for cough, hemoptysis, SOB.   Cardiology: Negative for chest pain, palpitations.  GI:  Negative for constipation or diarrhea.  GU: Negative for urinary frequency, dysuria, hematuria, nocturia.   Skin: Negative for rash.  Hematology: Negative for easy bruising, blood clots.    Musculo-skelatal: Negative for back pain, muscle pain, weakness.  Neurologic: Negative for headaches, dizziness, vertigo, memory problems not related to HE.  Psychology: Negative for anxiety, depression.     FAMILY HISTORY:  The patient has no knowledge of the father's medical condition.    The mother died of lung cancer.    There is no family history of liver disease.      SOCIAL HISTORY:   The patient is separated.     The spouse has been tested for HCV and is negative.  The patient has 1 child.    The patient currently smokes half pack of tobacco daily.    The patient has never consumed significant amounts of alcohol.    The patient currently works full time lawn care.      PHYSICAL EXAMINATION:  BP 138/89 mmHg   Pulse 66   Temp(Src) 94.9 ??F (34.9 ??C) (Tympanic)   Resp 16   Ht 5' 10" (1.778 m)   Wt 153 lb 12.8 oz (69.763 kg)   BMI 22.07 kg/m2   SpO2 100%  General: No acute distress.   Eyes: Sclera anicteric.   ENT: No oral lesions.  Thyroid normal.  Nodes: No adenopathy.   Skin: No spider angiomata.  No jaundice.  No palmar erythema.  Respiratory: Lungs clear to auscultation.   Cardiovascular: Regular heart rate.  No murmurs.  No JVD.  Abdomen: Soft non-tender.  Liver size normal to percussion/palpation.  Spleen not palpable. No obvious ascites.  Extremities: No edema.  No muscle wasting.  No gross arthritic changes.  Neurologic: Alert and oriented.  Cranial nerves grossly intact.  No asterixis.    LABORATORY STUDIES:  [facility] Ref Rng 02/12/2014 12/11/2010   WBC 3.4 - 10.8 x10E3/uL 6.4 11.0   ANC 1.4 - 7.0 x10E3/uL 3.5 6.4   HGB 11.1 - 15.9 g/dL 12.3 11.6 (L)   PLT 150 - 379 x10E3/uL 257 239   AST 0 - 40 IU/L 46 (H) 23   ALT 0 - 32 IU/L 66 (H) 58   Alk Phos 39 - 117 IU/L 53 65   Bili, Total 0.0 - 1.2 mg/dL <0.2 0.3   Bili, Direct 0.00 - 0.40 mg/dL 0.08 0.1   Albumin 3.5 - 5.5 g/dL 4.4 3.0 (L)   BUN 6 - 24 mg/dL 18 13   Creat 0.57 - 1.00 mg/dL 0.95 0.6   Na 134 - 144 mmol/L 143 143   K 3.5 - 5.2 mmol/L 4.5 2.7 (LL)   Cl 97 - 108 mmol/L 104 112 (H)   CO2 18 - 29 mmol/L 25 25   Glucose 65 - 99 mg/dL 82 78     SEROLOGIES:  Serologies Latest Ref Rng 02/12/2014   Hep A Ab, Total Negative Negative   Hep B Surface Ag Negative Negative   Hep B Core Ab, Total Negative Negative   Hep B Surface AB QL  Non Reactive   Hep C Genotype  1a   HCV RT-PCR, Quant  [id number]    Ferritin 15 - 150 ng/mL 90   Iron % Saturation 15 - 55 % 10 (L)     LIVER HISTOLOGY:  04/2014.  FibroScan performed at [facility] [place]. EkPa was 3.7.  Suggested fibrosis level is F0-1.    ENDOSCOPIC PROCEDURES:  Not available or performed    RADIOLOGY:  03/2014.  Ultrasound of liver.  Echogenic consistent with chronic liver disease.  2.9 cm rounded liver mass, likely hemangioma.  No dilated bile ducts.  No ascites.    OTHER TESTING:  Not available or performed    ASSESSMENT AND PLAN:  Chronic HCV in the setting of no to minimal fibrosis as assessed with fibroscan.  The most recent laboratory studies indicate that the liver transaminases are normal, alkaline phosphatase is normal, tests of hepatic synthetic and metabolic function are normal, and the platelet count is normal.  Labs were deferred today.     Serologic and virologic studies to assess for other causes of chronic liver disease were negative.    HCV.  The patient has genotype 1A and is treatment naive.  We discussed treatment options. One treatment regime is Harvoni, a combination pill of 400 mg sofosbuvir and 90 mg ledipasvir. The treatment regime is one tablet daily for 8 weeks.  This treatment regime was explained in detail, including dosing and side effects.  She would like to be treated with this regime. This will be ordered.      I explained to her that her insurance carrier may deny coverage of the Harvoni due to this medications extreme cost and her apparent mild disease.     I have explained to her that I will order the medications from the specialty pharmacy and they will be delivered to her home.  She may begin taking the treatment medications as soon as they arrive.  She is to call and make an appointment for treatment week #4 once she begins therapy.  She voiced understanding of this plan.     New possible liver mass on recent screening ultrasound. Finding is likely representing a hemangioma and I have reviewed this at length with the  patient.  She understands that she will need CT for further evaluation.  Triple phase CT was ordered today.     The patient was directed to continue all current medications at the current dosages.  There are no contraindications for the patient to take any medications that are necessary for treatment of other medical issues.    The patient was counseled regarding alcohol consumption.      Vaccination for viral hepatitis A and B is recommended since the patient has no serologic evidence of previous exposure or vaccination with immunity.    All of the above issues were discussed with the patient.  All questions were answered.  The patient expressed a clear understanding of the above.    Alpha in 6 months, earlier if she is approved for HCV treatment    Jaclynn Major, NP   [facility], [place] [address]   [phone]

## 2014-04-15 NOTE — Telephone Encounter (Signed)
Patient has bcbs however is a product of medicaid, therefore she will need drugs/alcohol done to submit to insurance company. Thanks

## 2014-04-22 ENCOUNTER — Other Ambulatory Visit: Payer: MEDICAID | Primary: Family Medicine

## 2014-04-22 ENCOUNTER — Inpatient Hospital Stay: Admit: 2014-04-22 | Primary: Family Medicine

## 2014-04-22 ENCOUNTER — Encounter

## 2014-04-22 NOTE — [place] Notes (Signed)
Labs ordered for medication approval.

## 2014-04-28 ENCOUNTER — Inpatient Hospital Stay: Admit: 2014-04-28 | Primary: Family Medicine

## 2014-05-03 LAB — 7-DRUG SCREEN, WHOLE BLD
Amphetamines: NEGATIVE
Barbiturates: NEGATIVE
Cocaine and metabolites: NEGATIVE
Opiates: NEGATIVE
Oxycodone: NEGATIVE
Phencyclidine: NEGATIVE
THC (Marijuana) metabolite: NEGATIVE

## 2014-05-03 LAB — HEPATIC FUNCTION PANEL
ALT (SGPT): 75 IU/L — ABNORMAL HIGH (ref 0–32)
AST (SGOT): 48 IU/L — ABNORMAL HIGH (ref 0–40)
Albumin: 4.1 g/dL (ref 3.5–5.5)
Alk. phosphatase: 64 IU/L (ref 39–117)
Bilirubin, direct: 0.07 mg/dL (ref 0.00–0.40)
Bilirubin, total: <0.2 mg/dL (ref 0.0–1.2)
Protein, total: 6.6 g/dL (ref 6.0–8.5)

## 2014-05-03 LAB — CBC WITH AUTOMATED DIFF
ABS. BASOPHILS: 0 10*3/uL (ref 0.0–0.2)
ABS. EOSINOPHILS: 0.3 10*3/uL (ref 0.0–0.4)
ABS. IMM. GRANS.: 0 10*3/uL (ref 0.0–0.1)
ABS. MONOCYTES: 0.6 10*3/uL (ref 0.1–0.9)
ABS. NEUTROPHILS: 4 10*3/uL (ref 1.4–7.0)
Abs Lymphocytes: 2.7 10*3/uL (ref 0.7–3.1)
BASOPHILS: 0 %
EOSINOPHILS: 4 %
HCT: 40 % (ref 34.0–46.6)
HGB: 13.5 g/dL (ref 11.1–15.9)
IMMATURE GRANULOCYTES: 0 %
Lymphocytes: 36 %
MCH: 30.2 pg (ref 26.6–33.0)
MCHC: 33.8 g/dL (ref 31.5–35.7)
MCV: 90 fL (ref 79–97)
MONOCYTES: 7 %
NEUTROPHILS: 53 %
PLATELET: 265 10*3/uL (ref 150–379)
RBC: 4.47 x10E6/uL (ref 3.77–5.28)
RDW: 14.5 % (ref 12.3–15.4)
WBC: 7.6 10*3/uL (ref 3.4–10.8)

## 2014-05-03 LAB — METABOLIC PANEL, BASIC
BUN/Creatinine ratio: 22 (ref 9–23)
BUN: 16 mg/dL (ref 6–24)
CO2: 24 mmol/L (ref 18–29)
Calcium: 9 mg/dL (ref 8.7–10.2)
Chloride: 103 mmol/L (ref 97–108)
Creatinine: 0.74 mg/dL (ref 0.57–1.00)
GFR est AA: 108 mL/min/{1.73_m2} (ref >59)
GFR est non-AA: 94 mL/min/{1.73_m2} (ref >59)
Glucose: 62 mg/dL — ABNORMAL LOW (ref 65–99)
Potassium: 4.1 mmol/L (ref 3.5–5.2)
Sodium: 143 mmol/L (ref 134–144)

## 2014-05-03 LAB — ETHYL ALCOHOL: Ethanol: NEGATIVE %

## 2014-05-06 ENCOUNTER — Inpatient Hospital Stay: Admit: 2014-05-06 | Payer: MEDICAID | Primary: Family Medicine

## 2014-05-06 DIAGNOSIS — N281 Cyst of kidney, acquired: Secondary | ICD-10-CM

## 2014-05-06 MED ORDER — IOPAMIDOL 61 % IV SOLN
300 mg iodine /mL (61 %) | Freq: Once | INTRAVENOUS | Status: AC
Start: 2014-05-06 — End: 2014-05-06
  Administered 2014-05-06: 13:00:00 via INTRAVENOUS

## 2014-05-06 MED ORDER — BARIUM SULFATE 2 % ORAL SUSP
2.1 % (w/v), 2.0 % (w/w) | Freq: Once | ORAL | Status: AC
Start: 2014-05-06 — End: 2014-05-06
  Administered 2014-05-06: 13:00:00 via ORAL

## 2014-05-06 MED FILL — ISOVUE-300  61 % INTRAVENOUS SOLUTION: 300 mg iodine /mL (61 %) | INTRAVENOUS | Qty: 100

## 2014-05-06 MED FILL — READI-CAT 2  2.1 % (W/V), 2.0 % (W/W) ORAL SUSPENSION: 2.1 % (w/v), 2.0 % (w/w) | ORAL | Qty: 900

## 2014-05-12 NOTE — Telephone Encounter (Signed)
Updated on recent ultrasound. No hepatic masses.  Follow up as scheduled.

## 2014-09-02 ENCOUNTER — Ambulatory Visit (INDEPENDENT_AMBULATORY_CARE_PROVIDER_SITE_OTHER): Payer: Medicaid Other | Admitting: Family Medicine

## 2014-09-02 ENCOUNTER — Encounter: Payer: Self-pay | Admitting: Family Medicine

## 2014-09-02 VITALS — BP 136/82 | HR 69 | Temp 98.7°F | Resp 16 | Ht 70.0 in | Wt 142.6 lb

## 2014-09-02 DIAGNOSIS — Z8249 Family history of ischemic heart disease and other diseases of the circulatory system: Secondary | ICD-10-CM | POA: Diagnosis not present

## 2014-09-02 DIAGNOSIS — F1190 Opioid use, unspecified, uncomplicated: Secondary | ICD-10-CM | POA: Diagnosis not present

## 2014-09-02 DIAGNOSIS — B1920 Unspecified viral hepatitis C without hepatic coma: Secondary | ICD-10-CM | POA: Insufficient documentation

## 2014-09-02 DIAGNOSIS — Z1211 Encounter for screening for malignant neoplasm of colon: Secondary | ICD-10-CM | POA: Diagnosis not present

## 2014-09-02 DIAGNOSIS — F1110 Opioid abuse, uncomplicated: Secondary | ICD-10-CM

## 2014-09-02 DIAGNOSIS — M1990 Unspecified osteoarthritis, unspecified site: Secondary | ICD-10-CM | POA: Diagnosis not present

## 2014-09-02 DIAGNOSIS — M1612 Unilateral primary osteoarthritis, left hip: Secondary | ICD-10-CM

## 2014-09-02 DIAGNOSIS — B182 Chronic viral hepatitis C: Secondary | ICD-10-CM | POA: Diagnosis not present

## 2014-09-02 DIAGNOSIS — Z720 Tobacco use: Secondary | ICD-10-CM | POA: Diagnosis not present

## 2014-09-02 DIAGNOSIS — J3081 Allergic rhinitis due to animal (cat) (dog) hair and dander: Secondary | ICD-10-CM

## 2014-09-02 DIAGNOSIS — J309 Allergic rhinitis, unspecified: Secondary | ICD-10-CM | POA: Insufficient documentation

## 2014-09-02 MED ORDER — FLUTICASONE PROPIONATE 50 MCG/ACT NA SUSP: 2.0000 | Freq: Every day | NASAL | Status: AC

## 2014-09-02 MED ORDER — HYDROCODONE-ACETAMINOPHEN 5-325 MG PO TABS
1.0000 | ORAL_TABLET | Freq: Every day | ORAL | Status: DC
Start: 2014-09-02 — End: 2015-12-14

## 2014-09-02 MED ORDER — MELOXICAM 15 MG PO TABS: 15.0000 mg | ORAL_TABLET | Freq: Every day | ORAL | Status: DC

## 2014-09-02 MED ORDER — BUPROPION HCL ER (SR) 150 MG PO TB12: 150.0000 mg | ORAL_TABLET | Freq: Two times a day (BID) | ORAL | Status: AC

## 2014-09-02 MED ORDER — LORATADINE 10 MG PO TABS: 10.0000 mg | ORAL_TABLET | Freq: Every day | ORAL | Status: AC

## 2014-09-02 NOTE — Assessment & Plan Note (Signed)
Start Mobic for pain. Renewed 1 vicodin daily for pain as needed. Discussed need for orthopedic intervention and possible hip replacement. Pt referred to orthopedics today for evaluation.

## 2014-09-02 NOTE — Assessment & Plan Note (Signed)
Wellbutrin restarted for smoking cessation. [facility] Smoking cessation materials given. Follow-up 6 weeks for efficacy. Goal to stop smoking in 3 mos.

## 2014-09-02 NOTE — Progress Notes (Signed)
Subjective:    Patient ID: Heather Rangel, female    DOB: 08-Aug-1965, 49 y.o.   MRN: [id number]  HPI: Heather Rangel is a 49 y.o. female presenting on 09/02/2014 for Establish Care; Arthritis; and Allergic Rhinitis    HPI  Pt presents to establish care today. Previous care provider was Dr Mel[facility]arkin [facility]. :  Current medical problems include: Degenerative disc disease/ Left hip arthritis: Pt has septic hip 10 years ago. Was told she needed hip replacement. Was recently told by orthopedist that she has degenerative disc diease. Last seen by ortho 3 mos ago was scheduled for SI joint injection but missed due to family reasons. Smoking cessation: Pt is trying to quit and has been taking well butrin. Seasonal allergies: Taking flonase OTC but still having allergies symptoms Hepatits C: [facility] [place]. Was denied medication by insurance- in appeal. Pt will need local GI.  Health Maintenance: Has not had colonoscopy.  Pap smear 2013: Normal. Unsure if HPV co-testing. Smoking- 1/2 pack per day. Reduced when using wellbutrin. Works in [employer]- very active outside daily. Occasional sunscreen use.   Past Medical History  Diagnosis Date  . Allergy   . Arthritis   . Headache   . Hip pain    History   Social History  . Marital Status: Single    Spouse Name: N/A  . Number of Children: N/A  . Years of Education: N/A   Occupational History  . Not on file.   Social History Main Topics  . Smoking status: Light Tobacco Smoker  . Smokeless tobacco: Not on file  . Alcohol Use: Yes  . Drug Use: No  . Sexual Activity: Not on file   Other Topics Concern  . Not on file   Social History Narrative  . No narrative on file   Family History  Problem Relation Age of Onset  . Cancer Mother     lung cancer  . Heart disease Father   . Alcohol abuse Father   . Cancer Paternal Grandmother   . Cancer Paternal  Grandfather    No current outpatient prescriptions on file prior to visit.   No current facility-administered medications on file prior to visit.    Review of Systems Per HPI unless specifically indicated above     Objective:    BP 136/82 mmHg  Pulse 69  Temp(Src) 98.7 F (37.1 C) (Oral)  Resp 16  Ht 5\' 10"  (1.778 m)  Wt 142 lb 9.6 oz (64.683 kg)  BMI 20.46 kg/m2  LMP   Wt Readings from Last 3 Encounters:  09/02/14 142 lb 9.6 oz (64.683 kg)    Physical Exam  Constitutional: She is oriented to person, place, and time. She appears well-developed and well-nourished. No distress.  Neck: Normal range of motion. Neck supple. No thyromegaly present.  Cardiovascular: Normal rate and regular rhythm.  Exam reveals no gallop and no friction rub.   No murmur heard. Pulmonary/Chest: Effort normal and breath sounds normal.  Abdominal: Soft. Bowel sounds are normal. There is no tenderness. There is no rebound.  Musculoskeletal: Normal range of motion. She exhibits no edema or tenderness.  Lymphadenopathy:    She has no cervical adenopathy.  Neurological: She is alert and oriented to person, place, and time.  Skin: Skin is warm and dry. She is not diaphoretic.   No results found for this or any previous visit.  Assessment & Plan:   Problem List Items Addressed This Visit      Respiratory   Allergic rhinitis    Due to pet dander. Discussed hepafilters and pillow protectors. Keep pets out of room. Renew Flonase and Claritin.       Relevant Orders   Comprehensive Metabolic Panel (CMET)     Digestive   Hepatitis C    Pt has not been treated due to insurance denial, her appeal is pending. Refer to gastroenterology today for evaluation and treatment of Hep C.       Relevant Orders   Hepatitis C Antibody   Ambulatory referral to General Surgery     Musculoskeletal and Integument   Arthritis of left hip - Primary    Start Mobic for pain. Renewed 1 vicodin daily for pain as  needed. Discussed need for orthopedic intervention and possible hip replacement. Pt referred to orthopedics today for evaluation.        Relevant Medications   meloxicam (MOBIC) 15 MG tablet   HYDROcodone-acetaminophen (NORCO/VICODIN) 5-325 MG per tablet   Other Relevant Orders   Ambulatory referral to Orthopedic Surgery   Drug Screen, Urine     Other   Tobacco use    Wellbutrin restarted for smoking cessation. Pt referred to [facility] Quitline. Smoking cessation materials given. Follow-up 6 weeks for efficacy. Goal to stop smoking in 3 mos.       Relevant Medications   buPROPion (WELLBUTRIN SR) 150 MG 12 hr tablet    Other Visit Diagnoses    Family history of heart disease        Relevant Orders    Lipid Profile    Colon cancer screening        Refer to GI to set up screening colonscopy.     Relevant Orders    Ambulatory referral to General Surgery    Opioid use disorder, mild, on maintenance therapy        Pt on maintenance Norco for arthritis. Aware this office does not prescribe longterm narcotics and no refills on today's prescription. Urine drug screen ordered    Relevant Orders    Drug Screen, Urine       Meds ordered this encounter  Medications  . DISCONTD: fluticasone (FLONASE) 50 MCG/ACT nasal spray    Sig: Place into both nostrils daily.  Marland Kitchen DISCONTD: HYDROcodone-acetaminophen (NORCO/VICODIN) 5-325 MG per tablet    Sig: Take 1 tablet by mouth every 6 (six) hours as needed for moderate pain.  Marland Kitchen DISCONTD: buPROPion (WELLBUTRIN) 100 MG tablet    Sig: Take 100 mg by mouth 2 (two) times daily.  Marland Kitchen buPROPion (WELLBUTRIN SR) 150 MG 12 hr tablet    Sig: Take 1 tablet (150 mg total) by mouth 2 (two) times daily. Take 150 mg once daily at bedtime for 3 days. Then take 150mg  twice daily.    Dispense:  30 tablet    Refill:  2    Order Specific Question:  Supervising Provider    Answer:  Janeann Forehand [phone]  . meloxicam (MOBIC) 15 MG tablet    Sig: Take 1 tablet (15 mg  total) by mouth daily.    Dispense:  30 tablet    Refill:  1    Order Specific Question:  Supervising Provider    Answer:  Janeann Forehand [phone]  . HYDROcodone-acetaminophen (NORCO/VICODIN) 5-325 MG per tablet    Sig: Take 1 tablet by mouth at bedtime.    Dispense:  30  tablet    Refill:  0    Order Specific Question:  Supervising Provider    Answer:  Janeann Forehand [[id number]]  . fluticasone (FLONASE) 50 MCG/ACT nasal spray    Sig: Place 2 sprays into both nostrils daily.    Dispense:  16 g    Refill:  11    Order Specific Question:  Supervising Provider    Answer:  Janeann Forehand [phone]  . loratadine (CLARITIN) 10 MG tablet    Sig: Take 1 tablet (10 mg total) by mouth daily.    Dispense:  30 tablet    Refill:  11    Order Specific Question:  Supervising Provider    Answer:  Janeann Forehand [phone]      Follow up plan: Return in about 6 weeks (around 10/14/2014).

## 2014-09-02 NOTE — Patient Instructions (Addendum)
Hip Pain: We will refer you to orthopedics for your hip pain for management. I will cover your Vicodin until you are able to see the orthopedist and they can take over your pain management.   Seasonal allergies: Restart flonase. Take claritin daily.  Smoking cessation: Well butrin  at bedtime for the first 3 days then resume taking twice daily. Try [facility] Quitline.   Bupropion sustained-release tablets (smoking cessation) What is this medicine? BUPROPION (byoo PROE pee on) is used to help people quit smoking. This medicine may be used for other purposes; ask your health care provider or pharmacist if you have questions. COMMON BRAND NAME(S): Buproban, Zyban What should I tell my health care provider before I take this medicine? They need to know if you have any of these conditions: -an eating disorder, such as anorexia or bulimia -bipolar disorder or psychosis -diabetes or high blood sugar, treated with medication -glaucoma -head injury or brain tumor -heart disease, previous heart attack, or irregular heart beat -high blood pressure -kidney or liver disease -seizures -suicidal thoughts or a previous suicide attempt -Tourette's syndrome -weight loss -an unusual or allergic reaction to bupropion, other medicines, foods, dyes, or preservatives -breast-feeding -pregnant or trying to become pregnant How should I use this medicine? Take this medicine by mouth with a glass of water. Follow the directions on the prescription label. You can take it with or without food. If it upsets your stomach, take it with food. Do not cut, crush or chew this medicine. Take your medicine at regular intervals. If you take this medicine more than once a day, take your second dose at least 8 hours after you take your first dose. To limit difficulty in sleeping, avoid taking this medicine at bedtime. Do not take your medicine more often than directed. Do not stop taking this medicine suddenly except upon the  advice of your doctor. Stopping this medicine too quickly may cause serious side effects. A special MedGuide will be given to you by the pharmacist with each prescription and refill. Be sure to read this information carefully each time. Talk to your pediatrician regarding the use of this medicine in children. Special care may be needed. Overdosage: If you think you have taken too much of this medicine contact a poison control center or emergency room at once. NOTE: This medicine is only for you. Do not share this medicine with others. What if I miss a dose? If you miss a dose, skip the missed dose and take your next tablet at the regular time. There should be at least 8 hours between doses. Do not take double or extra doses. What may interact with this medicine? Do not take this medicine with any of the following medications: -linezolid -MAOIs like Azilect, Carbex, Eldepryl, Marplan, Nardil, and Parnate -methylene blue (injected into a vein) -other medicines that contain bupropion like Wellbutrin This medicine may also interact with the following medications: -alcohol -certain medicines for anxiety or sleep -certain medicines for blood pressure like metoprolol, propranolol -certain medicines for depression or psychotic disturbances -certain medicines for HIV or AIDS like efavirenz, lopinavir, nelfinavir, ritonavir -certain medicines for irregular heart beat like propafenone, flecainide -certain medicines for Parkinson's disease like amantadine, levodopa -certain medicines for seizures like carbamazepine, phenytoin, phenobarbital -cimetidine -clopidogrel -cyclophosphamide -furazolidone -isoniazid -nicotine -orphenadrine -procarbazine -steroid medicines like prednisone or cortisone -stimulant medicines for attention disorders, weight loss, or to stay awake -tamoxifen -theophylline -thiotepa -ticlopidine -tramadol -warfarin This list may not describe all possible interactions. Give  your  health care provider a list of all the medicines, herbs, non-prescription drugs, or dietary supplements you use. Also tell them if you smoke, drink alcohol, or use illegal drugs. Some items may interact with your medicine. What should I watch for while using this medicine? Visit your doctor or health care professional for regular checks on your progress. This medicine should be used together with a patient support program. It is important to participate in a behavioral program, counseling, or other support program that is recommended by your health care professional. Patients and their families should watch out for new or worsening thoughts of suicide or depression. Also watch out for sudden changes in feelings such as feeling anxious, agitated, panicky, irritable, hostile, aggressive, impulsive, severely restless, overly excited and hyperactive, or not being able to sleep. If this happens, especially at the beginning of treatment or after a change in dose, call your health care professional. Avoid alcoholic drinks while taking this medicine. Drinking excessive alcoholic beverages, using sleeping or anxiety medicines, or quickly stopping the use of these agents while taking this medicine may increase your risk for a seizure. Do not drive or use heavy machinery until you know how this medicine affects you. This medicine can impair your ability to perform these tasks. Do not take this medicine close to bedtime. It may prevent you from sleeping. Your mouth may get dry. Chewing sugarless gum or sucking hard candy, and drinking plenty of water may help. Contact your doctor if the problem does not go away or is severe. Do not use nicotine patches or chewing gum without the advice of your doctor or health care professional while taking this medicine. You may need to have your blood pressure taken regularly if your doctor recommends that you use both nicotine and this medicine together. What side effects may I  notice from receiving this medicine? Side effects that you should report to your doctor or health care professional as soon as possible: -allergic reactions like skin rash, itching or hives, swelling of the face, lips, or tongue -breathing problems -changes in vision -confusion -fast or irregular heartbeat -hallucinations -increased blood pressure -redness, blistering, peeling or loosening of the skin, including inside the mouth -seizures -suicidal thoughts or other mood changes -unusually weak or tired -vomiting Side effects that usually do not require medical attention (report to your doctor or health care professional if they continue or are bothersome): -change in sex drive or performance -constipation -headache -loss of appetite -nausea -tremors -weight loss This list may not describe all possible side effects. Call your doctor for medical advice about side effects. You may report side effects to FDA at 1-800-FDA-1088. Where should I keep my medicine? Keep out of the reach of children. Store at room temperature between 20 and 25 degrees C (68 and 77 degrees F). Protect from light. Keep container tightly closed. Throw away any unused medicine after the expiration date. NOTE: This sheet is a summary. It may not cover all possible information. If you have questions about this medicine, talk to your doctor, pharmacist, or health care provider.  2015, Elsevier/Gold Standard. (2012-10-19 10:55:10)

## 2014-09-02 NOTE — Assessment & Plan Note (Signed)
Pt has not been treated due to insurance denial, her appeal is pending. Refer to gastroenterology today for evaluation and treatment of Hep C.

## 2014-09-02 NOTE — Assessment & Plan Note (Signed)
Due to pet dander. Discussed hepafilters and pillow protectors. Keep pets out of room. Renew Flonase and Claritin.

## 2014-09-11 ENCOUNTER — Telehealth: Payer: Self-pay | Admitting: Family Medicine

## 2014-09-11 NOTE — Telephone Encounter (Signed)
Pt has appointment on 09/18/2014 at 10:00 am with Dr. Katrinka BlazingSmith, Cristal Deerhristopher. LMTCB

## 2014-09-12 NOTE — Telephone Encounter (Signed)
Called  twice and finally letter was send.   Heather Rangel

## 2014-09-15 ENCOUNTER — Ambulatory Visit: Payer: Medicaid Other | Admitting: Gastroenterology

## 2014-10-06 ENCOUNTER — Ambulatory Visit: Payer: Medicaid Other | Admitting: Family Medicine

## 2014-10-07 ENCOUNTER — Encounter: Attending: Family | Primary: Family Medicine

## 2014-10-20 ENCOUNTER — Emergency Department: Payer: Self-pay

## 2014-10-20 ENCOUNTER — Emergency Department
Admission: EM | Admit: 2014-10-20 | Discharge: 2014-10-21 | Disposition: A | Discharge status: Home / Self Care | Payer: Self-pay | Attending: Emergency Medicine | Admitting: Emergency Medicine

## 2014-10-20 DIAGNOSIS — F1110 Opioid abuse, uncomplicated: Secondary | ICD-10-CM | POA: Insufficient documentation

## 2014-10-20 DIAGNOSIS — F1410 Cocaine abuse, uncomplicated: Secondary | ICD-10-CM | POA: Insufficient documentation

## 2014-10-20 DIAGNOSIS — W1839XA Other fall on same level, initial encounter: Secondary | ICD-10-CM | POA: Insufficient documentation

## 2014-10-20 DIAGNOSIS — N179 Acute kidney failure, unspecified: Secondary | ICD-10-CM | POA: Insufficient documentation

## 2014-10-20 DIAGNOSIS — Z720 Tobacco use: Secondary | ICD-10-CM | POA: Insufficient documentation

## 2014-10-20 DIAGNOSIS — Y998 Other external cause status: Secondary | ICD-10-CM | POA: Insufficient documentation

## 2014-10-20 DIAGNOSIS — Y9389 Activity, other specified: Secondary | ICD-10-CM | POA: Insufficient documentation

## 2014-10-20 DIAGNOSIS — Z791 Long term (current) use of non-steroidal anti-inflammatories (NSAID): Secondary | ICD-10-CM | POA: Insufficient documentation

## 2014-10-20 DIAGNOSIS — Y9289 Other specified places as the place of occurrence of the external cause: Secondary | ICD-10-CM | POA: Insufficient documentation

## 2014-10-20 DIAGNOSIS — F1910 Other psychoactive substance abuse, uncomplicated: Secondary | ICD-10-CM

## 2014-10-20 DIAGNOSIS — T148 Other injury of unspecified body region: Secondary | ICD-10-CM | POA: Insufficient documentation

## 2014-10-20 DIAGNOSIS — Z79899 Other long term (current) drug therapy: Secondary | ICD-10-CM | POA: Insufficient documentation

## 2014-10-20 DIAGNOSIS — Z7951 Long term (current) use of inhaled steroids: Secondary | ICD-10-CM | POA: Insufficient documentation

## 2014-10-20 DIAGNOSIS — F319 Bipolar disorder, unspecified: Secondary | ICD-10-CM | POA: Insufficient documentation

## 2014-10-20 HISTORY — DX: Depression, unspecified: F32.A

## 2014-10-20 HISTORY — DX: Bipolar disorder, unspecified: F31.9

## 2014-10-20 HISTORY — DX: Major depressive disorder, single episode, unspecified: F32.9

## 2014-10-20 LAB — COMPREHENSIVE METABOLIC PANEL
ALBUMIN: 5.2 g/dL — AB (ref 3.5–5.0)
ALT: 73 U/L — ABNORMAL HIGH (ref 14–54)
ANION GAP: 16 — AB (ref 5–15)
AST: 61 U/L — ABNORMAL HIGH (ref 15–41)
Alkaline Phosphatase: 55 U/L (ref 38–126)
BILIRUBIN TOTAL: 0.8 mg/dL (ref 0.3–1.2)
BUN: 40 mg/dL — ABNORMAL HIGH (ref 6–20)
CO2: 22 mmol/L (ref 22–32)
Calcium: 10.3 mg/dL (ref 8.9–10.3)
Chloride: 94 mmol/L — ABNORMAL LOW (ref 101–111)
Creatinine, Ser: 2.18 mg/dL — ABNORMAL HIGH (ref 0.44–1.00)
GFR calc non Af Amer: 25 mL/min — ABNORMAL LOW (ref >60)
GFR, EST AFRICAN AMERICAN: 29 mL/min — AB (ref >60)
GLUCOSE: 151 mg/dL — AB (ref 65–99)
POTASSIUM: 4 mmol/L (ref 3.5–5.1)
SODIUM: 132 mmol/L — AB (ref 135–145)
TOTAL PROTEIN: 8.6 g/dL — AB (ref 6.5–8.1)

## 2014-10-20 LAB — LIPASE, BLOOD: LIPASE: 22 U/L (ref 22–51)

## 2014-10-20 LAB — CBC
HEMATOCRIT: 43.8 % (ref 35.0–47.0)
HEMOGLOBIN: 14.5 g/dL (ref 12.0–16.0)
MCH: 28.2 pg (ref 26.0–34.0)
MCHC: 33.2 g/dL (ref 32.0–36.0)
MCV: 85.1 fL (ref 80.0–100.0)
Platelets: 225 10*3/uL (ref 150–440)
RBC: 5.15 MIL/uL (ref 3.80–5.20)
RDW: 13.5 % (ref 11.5–14.5)
WBC: 15.3 10*3/uL — ABNORMAL HIGH (ref 3.6–11.0)

## 2014-10-20 MED ORDER — SODIUM CHLORIDE 0.9 % IV BOLUS (SEPSIS)
1000.0000 mL | Freq: Once | INTRAVENOUS | Status: AC
  Administered 2014-10-20: 1000 mL via INTRAVENOUS

## 2014-10-20 MED ORDER — LORAZEPAM 2 MG/ML IJ SOLN
1.0000 mg | Freq: Once | INTRAMUSCULAR | Status: AC
  Administered 2014-10-20: 1 mg via INTRAVENOUS

## 2014-10-20 MED ORDER — LORAZEPAM 2 MG/ML IJ SOLN
1.0000 mg | Freq: Once | INTRAMUSCULAR | Status: AC
  Administered 2014-10-20: 1 mg via INTRAVENOUS
  Filled 2014-10-20: qty 1

## 2014-10-20 MED ORDER — LORAZEPAM 2 MG/ML IJ SOLN
INTRAMUSCULAR | Status: AC
  Administered 2014-10-20: 1 mg via INTRAVENOUS
  Filled 2014-10-20: qty 1

## 2014-10-20 NOTE — BH Specialist Note (Signed)
TTS attempted to assess Heather Rangel. TTS unable to arouse Heather Rangel at this time to complete TTS assessment

## 2014-10-20 NOTE — ED Notes (Signed)

## 2014-10-20 NOTE — ED Notes (Signed)
Pt awoke and is notably anxious, unable to sit still, and requests to leave ER medical facility. Will complete COWS accordingly.MD made aware. MD verbalized orders. Will complete accordingly.

## 2014-10-20 NOTE — ED Notes (Signed)
Pt comes into the ED via EMS from home.the patient states after leaving her probation officer today she went home and self injected  oxycotin and did 1 hit of crack cocaine.the patient c/o having chest pain and generalized abd pain.the patient states she is trying to self medicate for her bipolar and depression issues.Marland Kitchen

## 2014-10-20 NOTE — ED Notes (Signed)
Pt changed into paper scrubs at this time by RN and Tech. Belongings placed in bag and secure. Pt made aware of belongings being taken and secured.

## 2014-10-20 NOTE — ED Notes (Signed)
ED Tech provided pt with food tray, per pt request.

## 2014-10-20 NOTE — ED Notes (Signed)
Pt back from xray, states she was not able to get xray completed, pt was not able to sit still long enough, MD aware.Marland Kitchen

## 2014-10-20 NOTE — ED Provider Notes (Signed)
[facility] Emergency Department Provider Note ____________________________________________  Time seen: Approximately 3:26 PM  I have reviewed the triage vital signs and the nursing notes.   HISTORY  Chief Complaint Drug Problem  History Limited by altered mental status apparently due to polysubstance use  HPI Heather Rangel is a 49 y.o. female is brought in from EMS after injecting cocaine and OxyContin. She states she has been injecting crack for a long time but the OxyContin is new. She is somewhat disoriented and think she is in [place]. She denies SI, HI, hallucinations at this time.  States she is homeless and lives in her car. She states she was recently diagnosed with bipolar disorder and was started on Risperdal.  Past Medical History  Diagnosis Date  . Allergy   . Arthritis   . Headache   . Hip pain   . Bipolar 1 disorder   . Depression     Patient Active Problem List   Diagnosis Date Noted  . Arthritis of left hip 09/02/2014  . Hepatitis C 09/02/2014  . Tobacco use 09/02/2014  . Allergic rhinitis 09/02/2014    Past Surgical History  Procedure Laterality Date  . Knee arthrocentesis    . Wrist arthrocentesis      Right side    Current Outpatient Rx  Name  Route  Sig  Dispense  Refill  . buPROPion (WELLBUTRIN SR) 150 MG 12 hr tablet   Oral   Take 1 tablet (150 mg total) by mouth 2 (two) times daily. Take 150 mg once daily at bedtime for 3 days. Then take  twice daily.   30 tablet   2   . fluticasone (FLONASE) 50 MCG/ACT nasal spray   Each Nare   Place 2 sprays into both nostrils daily.   16 g   11   . HYDROcodone-acetaminophen (NORCO/VICODIN) 5-325 MG per tablet   Oral   Take 1 tablet by mouth at bedtime.   30 tablet   0   . loratadine (CLARITIN) 10 MG tablet   Oral   Take 1 tablet (10 mg total) by mouth daily.   30 tablet   11   . meloxicam (MOBIC) 15 MG tablet   Oral   Take 1 tablet (15 mg total) by  mouth daily.   30 tablet   1     Allergies Review of patient's allergies indicates no known allergies.  Family History  Problem Relation Age of Onset  . Cancer Mother     lung cancer  . Heart disease Father   . Alcohol abuse Father   . Cancer Paternal Grandmother   . Cancer Paternal Grandfather     Social History Social History  Substance Use Topics  . Smoking status: Light Tobacco Smoker  . Smokeless tobacco: None  . Alcohol Use: Yes    Review of Systems Constitutional: No fever/ Cardiovascular: Denies chest pain. Respiratory: Denies shortness of breath. Gastrointestinal: No abdominal pain.   Skin: Multiple bruises. When asked patient thinks they may have been from falling when she was high.  msk: Planes of pain between her toes; denies injecting there--states usually uses the back of her hands  10-point ROS otherwise negative.  ____________________________________________   PHYSICAL EXAM:  VITAL SIGNS: ED Triage Vitals  Enc Vitals Group     BP 10/20/14 1406 133/80 mmHg     Pulse Rate 10/20/14 1406 106     Resp 10/20/14 1406 20     Temp 10/20/14 1406 98.8  F (37.1 C)     Temp Source 10/20/14 1406 Oral     SpO2 10/20/14 1406 95 %     Weight 10/20/14 1407 130 lb (58.968 kg)     Height 10/20/14 1407 5\' 10"  (1.778 m)     Head Cir --      Peak Flow --      Pain Score 10/20/14 1407 10     Pain Loc --      Pain Edu? --      Excl. in GC? --    Constitutional: Sleepy but arouses and answers questions. Oriented to self but confused as to how she got to [place]. When awake appears to be agitated. Eyes: Conjunctivae are normal. PERRL. EOMI. Head: Atraumatic. Nose: No congestion/rhinnorhea. Mouth/Throat: Mucous membranes are slightly dry.  Oropharynx non-erythematous. Neck: No stridor.   Lymphatic: No cervical lymphadenopathy. Cardiovascular: Normal rate, regular rhythm. Grossly normal heart sounds.  Peripheral pulses 2+ B Respiratory: Normal respiratory  effort.  No retractions. Lungs CTAB. Gastrointestinal: Soft and nontender. No distention. Normal bowel sounds.  Musculoskeletal: No lower extremity tenderness nor edema.  No calf TTP. Neurologic:  Face & limbs move symmetrically; appears to be agitated with bizzare movements Skin:  Skin is warm, dry and intact. Several ecchymosis over lower extremities Psychiatric: acting intoxicated/high; somnolent when not interacting with staff ____________________________________________   LABS (all labs ordered are listed, but only abnormal results are displayed)  Labs Reviewed  COMPREHENSIVE METABOLIC PANEL - Abnormal; Notable for the following:    Sodium 132 (*)    Chloride 94 (*)    Glucose, Bld 151 (*)    BUN 40 (*)    Creatinine, Ser 2.18 (*)    Total Protein 8.6 (*)    Albumin 5.2 (*)    AST 61 (*)    ALT 73 (*)    GFR calc non Af Amer 25 (*)    GFR calc Af Amer 29 (*)    Anion gap 16 (*)    All other components within normal limits  CBC - Abnormal; Notable for the following:    WBC 15.3 (*)    All other components within normal limits  URINE DRUG SCREEN, QUALITATIVE ([facility] ONLY)  URINALYSIS COMPLETEWITH MICROSCOPIC ([facility] ONLY)  ETHANOL   ____________________________________________  EKG   Date: 10/20/2014  Rate: 100  Rhythm: normal sinus rhythm  QRS Axis: normal  Intervals: normal  ST/T Wave abnormalities: nonspecific T wave abnormality aVF  Conduction Disutrbances: none  Narrative Interpretation: unremarkable  ____________________________________________   INITIAL IMPRESSION / ASSESSMENT AND PLAN / ED COURSE  Pertinent labs & imaging results that were available during my care of the patient were reviewed by me and considered in my medical decision making (see chart for details).  11p-ED care xferred to Dr. Dolores Frame. Pt needs CXR when sober.   ____________________________________________   FINAL CLINICAL IMPRESSION(S) / ED DIAGNOSES  Substance abuse; acute renal  failure     Maurilio Lovely, MD 10/21/14 [phone]

## 2014-10-20 NOTE — BH Specialist Note (Signed)
This Clinical research associate has made (2) attempts to speak with client; who refused to talk; with grunts and moans and jerking movements; and with a blanket covering her head. This Clinical research associate also made an attempt to contact client's daughter--Sharelle Roninette--[phone]; with no answer.

## 2014-10-20 NOTE — ED Notes (Signed)
BEHAVIORAL HEALTH ROUNDING  Patient sleeping: Yes.  Patient alert and oriented: Sleeping  Behavior appropriate: Yes. ; If no, describe:  Nutrition and fluids offered: Sleeping  Toileting and hygiene offered: Sleeping  Sitter present: yes  Law enforcement present: Yes   

## 2014-10-20 NOTE — ED Notes (Signed)
BEHAVIORAL HEALTH ROUNDING Patient sleeping: Yes.   Patient alert and oriented: pt sleeping Behavior appropriate: Yes.  ; If no, describe:  Nutrition and fluids offered: No Toileting and hygiene offered: Yes  Sitter present: no Law enforcement present: No

## 2014-10-21 ENCOUNTER — Emergency Department: Payer: Self-pay

## 2014-10-21 LAB — BASIC METABOLIC PANEL
Anion gap: 6 (ref 5–15)
BUN: 28 mg/dL — AB (ref 6–20)
CALCIUM: 8.8 mg/dL — AB (ref 8.9–10.3)
CHLORIDE: 106 mmol/L (ref 101–111)
CO2: 25 mmol/L (ref 22–32)
CREATININE: 1.03 mg/dL — AB (ref 0.44–1.00)
Glucose, Bld: 135 mg/dL — ABNORMAL HIGH (ref 65–99)
Potassium: 3.5 mmol/L (ref 3.5–5.1)
SODIUM: 137 mmol/L (ref 135–145)

## 2014-10-21 LAB — ACETAMINOPHEN LEVEL: Acetaminophen (Tylenol), Serum: <10 ug/mL — ABNORMAL LOW (ref 10–30)

## 2014-10-21 LAB — SALICYLATE LEVEL

## 2014-10-21 LAB — ETHANOL: ALCOHOL ETHYL (B): 7 mg/dL — AB (ref <5)

## 2014-10-21 MED ORDER — CITALOPRAM HYDROBROMIDE 20 MG PO TABS
40.0000 mg | ORAL_TABLET | Freq: Every day | ORAL | Status: DC
  Administered 2014-10-21: 40 mg via ORAL
  Filled 2014-10-21: qty 2

## 2014-10-21 MED ORDER — ZIPRASIDONE MESYLATE 20 MG IM SOLR
INTRAMUSCULAR | Status: AC
  Administered 2014-10-21: 10 mg via INTRAMUSCULAR
  Filled 2014-10-21: qty 20

## 2014-10-21 MED ORDER — TRAZODONE HCL 50 MG PO TABS
50.0000 mg | ORAL_TABLET | Freq: Every day | ORAL | Status: DC
  Filled 2014-10-21: qty 1

## 2014-10-21 MED ORDER — ZIPRASIDONE MESYLATE 20 MG IM SOLR
10.0000 mg | Freq: Once | INTRAMUSCULAR | Status: AC
  Administered 2014-10-21: 10 mg via INTRAMUSCULAR
  Filled 2014-10-21: qty 20

## 2014-10-21 NOTE — ED Notes (Signed)
BEHAVIORAL HEALTH ROUNDING  Patient sleeping: Yes.  Patient alert and oriented: Sleeping  Behavior appropriate: Yes. ; If no, describe:  Nutrition and fluids offered: Sleeping  Toileting and hygiene offered: Sleeping  Sitter present: yes  Law enforcement present: Yes   

## 2014-10-21 NOTE — ED Notes (Signed)
BEHAVIORAL HEALTH ROUNDING Patient sleeping: Yes.   Patient alert and oriented: not applicable Behavior appropriate: Yes.  ; If no, describe:  Nutrition and fluids offered: Yes  Toileting and hygiene offered: Yes  Sitter present: not applicable Law enforcement present: Yes  

## 2014-10-21 NOTE — ED Notes (Signed)
BEHAVIORAL HEALTH ROUNDING Patient sleeping: Yes.   Patient alert and oriented: sleeping Behavior appropriate: Yes.  ; If no, describe:  Nutrition and fluids offered: sleeping Toileting and hygiene offered: Yes  Sitter present: yes Law enforcement present: Yes  

## 2014-10-21 NOTE — ED Notes (Signed)
BEHAVIORAL HEALTH ROUNDING Patient sleeping: Yes.   Patient alert and oriented: yes Behavior appropriate: Yes.  ; If no, describe:  Nutrition and fluids offered: Yes  Toileting and hygiene offered: Yes  Sitter present: not applicable Law enforcement present: Yes  
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
## 2014-10-21 NOTE — ED Notes (Signed)
BEHAVIORAL HEALTH ROUNDING Patient sleeping: Yes.   Patient alert and oriented: yes Behavior appropriate: no; If no, describe: patient started crying and saying she wanted to leave. RN comforted patient and she is now sleeping Nutrition and fluids offered: Yes  Toileting and hygiene offered: Yes  Sitter present: not applicable Law enforcement present: Yes

## 2014-10-21 NOTE — ED Notes (Signed)
Patient assigned to appropriate care area. Patient oriented to unit/care area: Informed that, for their safety, care areas are designed for safety and monitored by staff at all times; and visiting hours explained to patient. Patient verbalizes understanding, and verbal contract for safety obtained. 

## 2014-10-21 NOTE — ED Notes (Signed)
BEHAVIORAL HEALTH ROUNDING Patient sleeping: No. Patient alert and oriented: no Behavior appropriate: No.; If no, describe: combative yelling Nutrition and fluids offered: yes Toileting and hygiene offered: Yes  Sitter present: yes Law enforcement present: Yes
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
## 2014-10-21 NOTE — ED Notes (Addendum)
Pt got up going to the the bathroom hitting in the bathroom walls spoke to pt she is mad because she cant eat at this  time. Pt has had sandwich 2 bags of chips graham crackers and applesauce. Walked pt back to bed in the hallway explained to pt breakfest will be  here at 8am and if she starts yelling and hitting at thing will take glasses to protect  staff and herself.

## 2014-10-21 NOTE — BH Assessment (Signed)
Assessment Note  Heather Rangel is an 49 y.o. female. Heather Rangel was reported as arriving to the ED after being found passed out. Patient states to the TTS, that she did not know how she arrived to the hospital.  She states "I don't know what happened, I woke up here" and does not recall how she arrived.  She later stated that she contacted [facility] and that they gave her a number to a cab company, which she contacted and they brought her to the wrong hospital.  Nurse report that Patient comes into the ED via EMS from home. The patient states after leaving her probation officer today she went home and self injected 20mg  oxycotin and did 1 hit of crack cocaine. The patient states she is trying to self medicate for her bipolar and depression issues. Heather Rangel denied being depressed or anxious.  She denied current auditory or visual hallucinations.  She having homicidal or suicidal ideation or intent.  She reports that she is currently homeless.  She states that she uses oxycodone "every now and then".   Axis I: Bipolar, mixed Axis II: Deferred Axis III:  Past Medical History  Diagnosis Date  . Allergy   . Arthritis   . Headache   . Hip pain   . Bipolar 1 disorder   . Depression    Axis IV: economic problems and housing problems Axis V: 41-50 serious symptoms  Past Medical History:  Past Medical History  Diagnosis Date  . Allergy   . Arthritis   . Headache   . Hip pain   . Bipolar 1 disorder   . Depression     Past Surgical History  Procedure Laterality Date  . Knee arthrocentesis    . Wrist arthrocentesis      Right side    Family History:  Family History  Problem Relation Age of Onset  . Cancer Mother     lung cancer  . Heart disease Father   . Alcohol abuse Father   . Cancer Paternal Grandmother   . Cancer Paternal Grandfather     Social History:  reports that she has been smoking.  She does not have any smokeless tobacco history on file. She reports that she  drinks alcohol. She reports that she does not use illicit drugs.  Additional Social History:  Alcohol / Drug Use History of alcohol / drug use?: Yes Negative Consequences of Use: Legal, Financial Withdrawal Symptoms: Fever / Chills, Irritability Substance #1 Name of Substance 1: Oxycotin 1 - Age of First Use: 16 1 - Amount (size/oz): 20 mg 1 - Frequency: daily - varying amounts 1 - Last Use / Amount: 10/20/2014  CIWA: CIWA-Ar BP: 111/69 mmHg Pulse Rate: 81 COWS: Clinical Opiate Withdrawal Scale (COWS) Resting Pulse Rate: Pulse Rate greater than 120 Sweating: No report of chills or flushing Restlessness: Frequent shifting or extraneous movements of legs/arms Pupil Size: Pupils pinned or normal size for room light Bone or Joint Aches: Not present Runny Nose or Tearing: Not present GI Upset: Stomach cramps Tremor: Gross tremor or muscle twitching Yawning: No yawning Anxiety or Irritability: Patient obviously irritable/anxious Gooseflesh Skin: Skin is smooth COWS Total Score: 14  Allergies: No Known Allergies  Home Medications:  (Not in a hospital admission)  OB/GYN Status:  No LMP recorded. Patient is postmenopausal.  General Assessment Data Location of Assessment: [facility] ED TTS Assessment: In system Is this a Tele or Face-to-Face Assessment?: Face-to-Face Is this an Initial Assessment or a Re-assessment for  this encounter?: Initial Assessment Marital status: Single Is patient pregnant?: No Pregnancy Status: No Living Arrangements: Other (Comment) (Homeless) Can pt return to current living arrangement?: Yes Admission Status: Involuntary Is patient capable of signing voluntary admission?: Yes Referral Source: MD Insurance type: BCBS  Medical Screening Exam [facility] Walk-in ONLY) Medical Exam completed: Yes  Crisis Care Plan Living Arrangements: Other (Comment) (Homeless) Name of Psychiatrist: None reported Name of Therapist: None Reported  Education Status Is patient  currently in school?: No Current Grade: na Highest grade of school patient has completed: Some College - Completed sophmore year Name of school: [employer] person: na  Risk to self with the past 6 months Suicidal Ideation: Yes-Currently Present Has patient been a risk to self within the past 6 months prior to admission? : No Suicidal Intent: No Has patient had any suicidal intent within the past 6 months prior to admission? : No Is patient at risk for suicide?: No Suicidal Plan?: No Has patient had any suicidal plan within the past 6 months prior to admission? : Yes Access to Means: Yes Specify Access to Suicidal Means: Overdose on pills/access to pills What has been your use of drugs/alcohol within the last 12 months?: daily use Previous Attempts/Gestures: Yes How many times?: 2 Other Self Harm Risks: None reported Triggers for Past Attempts: None known Intentional Self Injurious Behavior: None Family Suicide History: No Persecutory voices/beliefs?: No Depression: No Depression Symptoms: Feeling angry/irritable Substance abuse history and/or treatment for substance abuse?: No Suicide prevention information given to non-admitted patients: Not applicable  Risk to Others within the past 6 months Homicidal Ideation: No Does patient have any lifetime risk of violence toward others beyond the six months prior to admission? : No Thoughts of Harm to Others: No Current Homicidal Intent: No Current Homicidal Plan: No Access to Homicidal Means: No Identified Victim: None reported History of harm to others?: No Assessment of Violence: On admission Violent Behavior Description: None reported Does patient have access to weapons?: No Criminal Charges Pending?: No Describe Pending Criminal Charges: None reported Does patient have a court date: No Is patient on probation?: Yes  Psychosis Hallucinations: None noted Delusions: None noted  Mental Status Report Appearance/Hygiene: In  hospital gown Eye Contact: Poor Motor Activity: Restlessness, Hyperactivity Speech: Loud Level of Consciousness: Alert Mood: Euthymic Affect: Euphoric Anxiety Level: Minimal Thought Processes: Tangential Judgement: Unable to Assess Orientation: Place, Time, Person Obsessive Compulsive Thoughts/Behaviors: Unable to Assess     ADLScreening [facility] Assessment Services) Patient's cognitive ability adequate to safely complete daily activities?: Yes Patient able to express need for assistance with ADLs?: Yes Independently performs ADLs?: Yes (appropriate for developmental age)  Prior Inpatient Therapy Prior Inpatient Therapy: No  Prior Outpatient Therapy Prior Outpatient Therapy: No  ADL Screening (condition at time of admission) Patient's cognitive ability adequate to safely complete daily activities?: Yes Patient able to express need for assistance with ADLs?: Yes Independently performs ADLs?: Yes (appropriate for developmental age)       Abuse/Neglect Assessment (Assessment to be complete while patient is alone) Physical Abuse: Denies Verbal Abuse: Denies Sexual Abuse: Denies Exploitation of patient/patient's resources: Denies Self-Neglect: Denies Values / Beliefs Cultural Requests During Hospitalization: None Spiritual Requests During Hospitalization: None   Advance Directives (For Healthcare) Does patient have an advance directive?: No Would patient like information on creating an advanced directive?: Yes - Educational materials given    Additional Information 1:1 In Past 12 Months?: No CIRT Risk: No Elopement Risk: No Does patient have medical clearance?:  Yes     Disposition:     On Site Evaluation by:   Reviewed with Physician:    Justice Deeds 10/21/2014 12:41 AM

## 2014-10-21 NOTE — ED Notes (Signed)
Pt woke up went to the bathroom asking for food and something to drink. Pt started crying tried to talk to the pt.

## 2014-10-21 NOTE — ED Notes (Signed)

## 2014-10-21 NOTE — BH Assessment (Signed)
Per Dr. Lucianne Muss in the [facility], look for inpatient. Printed [facility] Assessment and Facesheet to look at inpatient.

## 2014-10-21 NOTE — Consult Note (Signed)
[place] Psychiatry Consult   Reason for Consult:  Follow up Referring Physician:  ER Patient Identification: Heather Rangel MRN:  [id number] Principal Diagnosis: <principal problem not specified> Diagnosis:   Patient Active Problem List   Diagnosis Date Noted  . Arthritis of left hip [M19.90] 09/02/2014  . Hepatitis C [B19.20] 09/02/2014  . Tobacco use [Z72.0] 09/02/2014  . Allergic rhinitis [J30.9] 09/02/2014    Total Time spent with patient: 45 minutes  Subjective:   Heather Rangel is a 49 y.o. female patient admitted with several situation problems which includes moving from [place] to [place] to be closer to her daughter who is in a [place] for recovering heroine addiction. Pt got a pill of Oxycodone from a friend last night and was worried as it made her feel weird and came to ER .  HPI:  No H/O Inpt to psychiatry. Had a DWI many yrs ago when she was in [place].  No H/O suicide attempts. Pt wants to get help for her depression and situation stress as she recently moved and had a stable job for 7 yrs and is currently looking for a job. HPI Elements:     Past Medical History:  Past Medical History  Diagnosis Date  . Allergy   . Arthritis   . Headache   . Hip pain   . Bipolar 1 disorder   . Depression     Past Surgical History  Procedure Laterality Date  . Knee arthrocentesis    . Wrist arthrocentesis      Right side   Family History:  Family History  Problem Relation Age of Onset  . Cancer Mother     lung cancer  . Heart disease Father   . Alcohol abuse Father   . Cancer Paternal Grandmother   . Cancer Paternal Grandfather    Social History:  History  Alcohol Use  . Yes     History  Drug Use No    Social History   Social History  . Marital Status: Single    Spouse Name: N/A  . Number of Children: N/A  . Years of Education: N/A   Social History Main Topics  . Smoking status: Light Tobacco Smoker  . Smokeless tobacco: None  .  Alcohol Use: Yes  . Drug Use: No  . Sexual Activity: Not Asked   Other Topics Concern  . None   Social History Narrative   Additional Social History:    History of alcohol / drug use?: Yes Negative Consequences of Use: Legal, Financial Withdrawal Symptoms: Fever / Chills, Irritability Name of Substance 1: Oxycotin 1 - Age of First Use: 16 1 - Amount (size/oz): 20 mg 1 - Frequency: daily - varying amounts 1 - Last Use / Amount: 10/20/2014                   Allergies:  No Known Allergies  Labs:  Results for orders placed or performed during the hospital encounter of 10/20/14 (from the past 48 hour(s))  Ethanol     Status: Abnormal   Collection Time: 10/20/14  2:17 PM  Result Value Ref Range   Alcohol, Ethyl (B) 7 (H) <5 mg/dL    Comment:        LOWEST DETECTABLE LIMIT FOR SERUM ALCOHOL IS 5 mg/dL FOR MEDICAL PURPOSES ONLY   Acetaminophen level     Status: Abnormal   Collection Time: 10/20/14  2:17 PM  Result Value Ref Range   Acetaminophen (Tylenol), Serum <  10 (L) 10 - 30 ug/mL    Comment:        THERAPEUTIC CONCENTRATIONS VARY SIGNIFICANTLY. A RANGE OF 10-30 ug/mL MAY BE AN EFFECTIVE CONCENTRATION FOR MANY PATIENTS. HOWEVER, SOME ARE BEST TREATED AT CONCENTRATIONS OUTSIDE THIS RANGE. ACETAMINOPHEN CONCENTRATIONS >150 ug/mL AT 4 HOURS AFTER INGESTION AND >50 ug/mL AT 12 HOURS AFTER INGESTION ARE OFTEN ASSOCIATED WITH TOXIC REACTIONS.   Salicylate level     Status: None   Collection Time: 10/20/14  2:17 PM  Result Value Ref Range   Salicylate Lvl <2.4 2.8 - 30.0 mg/dL  Comprehensive metabolic panel     Status: Abnormal   Collection Time: 10/20/14  2:25 PM  Result Value Ref Range   Sodium 132 (L) 135 - 145 mmol/L   Potassium 4.0 3.5 - 5.1 mmol/L   Chloride 94 (L) 101 - 111 mmol/L   CO2 22 22 - 32 mmol/L   Glucose, Bld 151 (H) 65 - 99 mg/dL   BUN 40 (H) 6 - 20 mg/dL   Creatinine, Ser 2.18 (H) 0.44 - 1.00 mg/dL   Calcium 10.3 8.9 - 10.3 mg/dL    Total Protein 8.6 (H) 6.5 - 8.1 g/dL   Albumin 5.2 (H) 3.5 - 5.0 g/dL   AST 61 (H) 15 - 41 U/L   ALT 73 (H) 14 - 54 U/L   Alkaline Phosphatase 55 38 - 126 U/L   Total Bilirubin 0.8 0.3 - 1.2 mg/dL   GFR calc non Af Amer 25 (L) >60 mL/min   GFR calc Af Amer 29 (L) >60 mL/min    Comment: (NOTE) The eGFR has been calculated using the CKD EPI equation. This calculation has not been validated in all clinical situations. eGFR's persistently <60 mL/min signify possible Chronic Kidney Disease.    Anion gap 16 (H) 5 - 15  CBC     Status: Abnormal   Collection Time: 10/20/14  2:25 PM  Result Value Ref Range   WBC 15.3 (H) 3.6 - 11.0 K/uL   RBC 5.15 3.80 - 5.20 MIL/uL   Hemoglobin 14.5 12.0 - 16.0 g/dL   HCT 43.8 35.0 - 47.0 %   MCV 85.1 80.0 - 100.0 fL   MCH 28.2 26.0 - 34.0 pg   MCHC 33.2 32.0 - 36.0 g/dL   RDW 13.5 11.5 - 14.5 %   Platelets 225 150 - 440 K/uL  Lipase, blood     Status: None   Collection Time: 10/20/14  2:25 PM  Result Value Ref Range   Lipase 22 22 - 51 U/L  Basic metabolic panel     Status: Abnormal   Collection Time: 10/21/14  7:56 AM  Result Value Ref Range   Sodium 137 135 - 145 mmol/L   Potassium 3.5 3.5 - 5.1 mmol/L   Chloride 106 101 - 111 mmol/L   CO2 25 22 - 32 mmol/L   Glucose, Bld 135 (H) 65 - 99 mg/dL   BUN 28 (H) 6 - 20 mg/dL   Creatinine, Ser 1.03 (H) 0.44 - 1.00 mg/dL   Calcium 8.8 (L) 8.9 - 10.3 mg/dL   GFR calc non Af Amer >60 >60 mL/min   GFR calc Af Amer >60 >60 mL/min    Comment: (NOTE) The eGFR has been calculated using the CKD EPI equation. This calculation has not been validated in all clinical situations. eGFR's persistently <60 mL/min signify possible Chronic Kidney Disease.    Anion gap 6 5 - 15    Vitals: Blood pressure 117/64,  pulse 89, temperature 97.7 F (36.5 C), temperature source Axillary, resp. rate 20, height 5' 10" (1.778 m), weight 58.968 kg (130 lb), SpO2 96 %.  Risk to Self: Suicidal Ideation: Yes-Currently  Present Suicidal Intent: No Is patient at risk for suicide?: No Suicidal Plan?: No Access to Means: Yes Specify Access to Suicidal Means: Overdose on pills/access to pills What has been your use of drugs/alcohol within the last 12 months?: daily use How many times?: 2 Other Self Harm Risks: None reported Triggers for Past Attempts: None known Intentional Self Injurious Behavior: None Risk to Others: Homicidal Ideation: No Thoughts of Harm to Others: No Current Homicidal Intent: No Current Homicidal Plan: No Access to Homicidal Means: No Identified Victim: None reported History of harm to others?: No Assessment of Violence: On admission Violent Behavior Description: None reported Does patient have access to weapons?: No Criminal Charges Pending?: No Describe Pending Criminal Charges: None reported Does patient have a court date: No Prior Inpatient Therapy: Prior Inpatient Therapy: No Prior Outpatient Therapy: Prior Outpatient Therapy: No  No current facility-administered medications for this encounter.   Current Outpatient Prescriptions  Medication Sig Dispense Refill  . buPROPion (WELLBUTRIN SR) 150 MG 12 hr tablet Take 1 tablet (150 mg total) by mouth 2 (two) times daily. Take 150 mg once daily at bedtime for 3 days. Then take 136m twice daily. 30 tablet 2  . fluticasone (FLONASE) 50 MCG/ACT nasal spray Place 2 sprays into both nostrils daily. 16 g 11  . HYDROcodone-acetaminophen (NORCO/VICODIN) 5-325 MG per tablet Take 1 tablet by mouth at bedtime. 30 tablet 0  . loratadine (CLARITIN) 10 MG tablet Take 1 tablet (10 mg total) by mouth daily. 30 tablet 11  . meloxicam (MOBIC) 15 MG tablet Take 1 tablet (15 mg total) by mouth daily. 30 tablet 1    Musculoskeletal: Strength & Muscle Tone: within normal limits Gait & Station: normal Patient leans: N/A  Psychiatric Specialty Exam: Physical Exam  Nursing note and vitals reviewed.   ROS  Blood pressure 117/64, pulse 89,  temperature 97.7 F (36.5 C), temperature source Axillary, resp. rate 20, height 5' 10" (1.778 m), weight 58.968 kg (130 lb), SpO2 96 %.Body mass index is 18.65 kg/(m^2).  General Appearance: Casual  Eye Contact::  Fair  Speech:  Clear and Coherent  Volume:  Normal  Mood:  Anxious  Affect:  Appropriate  Thought Process:  Circumstantial  Orientation:  Full (Time, Place, and Person)  Thought Content:  Negative  Suicidal Thoughts:  No  Homicidal Thoughts:  No  Memory:  Immediate;   Fair Recent;   Fair Remote;   Fair adequate  Judgement:  Fair  Insight:  Fair  Psychomotor Activity:  Normal  Concentration:  Fair  Recall:  [employer] [place] Language: Fair  Akathisia:  No  Handed:  Right  AIMS (if indicated):     Assets:  Communication Skills Desire for Improvement Social Support Talents/Skills  ADL's:  Intact  Cognition: WNL  Sleep:      Medical Decision Making: Self-Limited or Minor (1)  Treatment Plan Summary: Plan Discharge pt home with follow up with [facility] and start pt on Celexa 40 mgs po daily  Plan:  No evidence of imminent risk to self or others at present.   Disposition:  As above  ,  K 10/21/2014 5:03 PM

## 2014-10-21 NOTE — Discharge Instructions (Signed)
Chemical Dependency  Chemical dependency is an addiction to drugs or alcohol. It is characterized by the repeated behavior of seeking out and using drugs and alcohol despite harmful consequences to the health and safety of ones self and others.   RISK FACTORS  There are certain situations or behaviors that increase a person's risk for chemical dependency. These include:  · A family history of chemical dependency.  · A history of mental health issues, including depression and anxiety.  · A home environment where drugs and alcohol are easily available to you.  · Drug or alcohol use at a young age.  SYMPTOMS   The following symptoms can indicate chemical dependency:  · Inability to limit the use of drugs or alcohol.  · Nausea, sweating, shakiness, and anxiety that occurs when alcohol or drugs are not being used.  · An increase in amount of drugs or alcohol that is necessary to get drunk or high.  People who experience these symptoms can assess their use of drugs and alcohol by asking themselves the following questions:  · Have you been told by friends or family that they are worried about your use of alcohol or drugs?  · Do friends and family ever tell you about things you did while drinking alcohol or using drugs that you do not remember?  · Do you lie about using alcohol or drugs or about the amounts you use?  · Do you have difficulty completing daily tasks unless you use alcohol or drugs?  · Is the level of your work or school performance lower because of your drug or alcohol use?  · Do you get sick from using drugs or alcohol but keep using anyway?  · Do you feel uncomfortable in social situations unless you use alcohol or drugs?  · Do you use drugs or alcohol to help forget problems?   An answer of yes to any of these questions may indicate chemical dependency. Professional evaluation is suggested.  Document Released: 02/15/2001 Document Revised: 05/16/2011 Document Reviewed: 04/29/2010  [employer]® Patient  Information ©2015 [employer]. This information is not intended to replace advice given to you by your health care provider. Make sure you discuss any questions you have with your health care provider.

## 2015-02-22 ENCOUNTER — Emergency Department
Admission: EM | Admit: 2015-02-22 | Discharge: 2015-02-22 | Disposition: A | Discharge status: Home / Self Care | Payer: Self-pay

## 2015-02-22 ENCOUNTER — Other Ambulatory Visit: Payer: Self-pay

## 2015-02-23 ENCOUNTER — Telehealth: Payer: Self-pay | Admitting: Emergency Medicine

## 2015-02-23 NOTE — ED Notes (Signed)
Called patient due to lwot to inquire about condition and follow up plans. ? Wrong number.  Person hung up.

## 2015-05-12 ENCOUNTER — Emergency Department: Payer: Self-pay

## 2015-05-12 ENCOUNTER — Emergency Department
Admission: EM | Admit: 2015-05-12 | Discharge: 2015-05-13 | Disposition: A | Discharge status: Home / Self Care | Payer: Self-pay | Attending: Emergency Medicine | Admitting: Emergency Medicine

## 2015-05-12 ENCOUNTER — Encounter: Payer: Self-pay | Admitting: Emergency Medicine

## 2015-05-12 DIAGNOSIS — F1410 Cocaine abuse, uncomplicated: Secondary | ICD-10-CM | POA: Insufficient documentation

## 2015-05-12 DIAGNOSIS — F17200 Nicotine dependence, unspecified, uncomplicated: Secondary | ICD-10-CM | POA: Insufficient documentation

## 2015-05-12 DIAGNOSIS — F1910 Other psychoactive substance abuse, uncomplicated: Secondary | ICD-10-CM

## 2015-05-12 DIAGNOSIS — F1110 Opioid abuse, uncomplicated: Secondary | ICD-10-CM | POA: Insufficient documentation

## 2015-05-12 DIAGNOSIS — T507X1A Poisoning by analeptics and opioid receptor antagonists, accidental (unintentional), initial encounter: Secondary | ICD-10-CM | POA: Insufficient documentation

## 2015-05-12 DIAGNOSIS — F1510 Other stimulant abuse, uncomplicated: Secondary | ICD-10-CM | POA: Insufficient documentation

## 2015-05-12 DIAGNOSIS — Z791 Long term (current) use of non-steroidal anti-inflammatories (NSAID): Secondary | ICD-10-CM | POA: Insufficient documentation

## 2015-05-12 DIAGNOSIS — W228XXA Striking against or struck by other objects, initial encounter: Secondary | ICD-10-CM | POA: Insufficient documentation

## 2015-05-12 DIAGNOSIS — Y9389 Activity, other specified: Secondary | ICD-10-CM | POA: Insufficient documentation

## 2015-05-12 DIAGNOSIS — Z7951 Long term (current) use of inhaled steroids: Secondary | ICD-10-CM | POA: Insufficient documentation

## 2015-05-12 DIAGNOSIS — Y9289 Other specified places as the place of occurrence of the external cause: Secondary | ICD-10-CM | POA: Insufficient documentation

## 2015-05-12 DIAGNOSIS — S0990XA Unspecified injury of head, initial encounter: Secondary | ICD-10-CM | POA: Insufficient documentation

## 2015-05-12 DIAGNOSIS — Z79899 Other long term (current) drug therapy: Secondary | ICD-10-CM | POA: Insufficient documentation

## 2015-05-12 DIAGNOSIS — Y998 Other external cause status: Secondary | ICD-10-CM | POA: Insufficient documentation

## 2015-05-12 LAB — CBC WITH DIFFERENTIAL/PLATELET
Basophils Absolute: 0 10*3/uL (ref 0–0.1)
Basophils Relative: 0 %
EOS ABS: 0.2 10*3/uL (ref 0–0.7)
Eosinophils Relative: 2 %
HEMATOCRIT: 37.2 % (ref 35.0–47.0)
HEMOGLOBIN: 12.5 g/dL (ref 12.0–16.0)
LYMPHS ABS: 1.1 10*3/uL (ref 1.0–3.6)
Lymphocytes Relative: 17 %
MCH: 28.9 pg (ref 26.0–34.0)
MCHC: 33.5 g/dL (ref 32.0–36.0)
MCV: 86.3 fL (ref 80.0–100.0)
MONO ABS: 1 10*3/uL — AB (ref 0.2–0.9)
MONOS PCT: 14 %
NEUTROS ABS: 4.4 10*3/uL (ref 1.4–6.5)
NEUTROS PCT: 67 %
Platelets: 184 10*3/uL (ref 150–440)
RBC: 4.32 MIL/uL (ref 3.80–5.20)
RDW: 14.5 % (ref 11.5–14.5)
WBC: 6.6 10*3/uL (ref 3.6–11.0)

## 2015-05-12 LAB — COMPREHENSIVE METABOLIC PANEL
ALBUMIN: 3.8 g/dL (ref 3.5–5.0)
ALT: 43 U/L (ref 14–54)
AST: 37 U/L (ref 15–41)
Alkaline Phosphatase: 48 U/L (ref 38–126)
Anion gap: 5 (ref 5–15)
BUN: 13 mg/dL (ref 6–20)
CHLORIDE: 106 mmol/L (ref 101–111)
CO2: 30 mmol/L (ref 22–32)
CREATININE: 0.7 mg/dL (ref 0.44–1.00)
Calcium: 8.8 mg/dL — ABNORMAL LOW (ref 8.9–10.3)
GFR calc non Af Amer: >60 mL/min (ref >60)
GLUCOSE: 107 mg/dL — AB (ref 65–99)
Potassium: 3.7 mmol/L (ref 3.5–5.1)
SODIUM: 141 mmol/L (ref 135–145)
Total Bilirubin: 0.7 mg/dL (ref 0.3–1.2)
Total Protein: 6.5 g/dL (ref 6.5–8.1)

## 2015-05-12 LAB — URINALYSIS COMPLETE WITH MICROSCOPIC (ARMC ONLY)
BACTERIA UA: NONE SEEN
Bilirubin Urine: NEGATIVE
Glucose, UA: NEGATIVE mg/dL
Hgb urine dipstick: NEGATIVE
Nitrite: NEGATIVE
PH: 6 (ref 5.0–8.0)
PROTEIN: 100 mg/dL — AB
Specific Gravity, Urine: 1.017 (ref 1.005–1.030)

## 2015-05-12 LAB — URINE DRUG SCREEN, QUALITATIVE (ARMC ONLY)
AMPHETAMINES, UR SCREEN: POSITIVE — AB
BENZODIAZEPINE, UR SCRN: NOT DETECTED
Barbiturates, Ur Screen: NOT DETECTED
Cannabinoid 50 Ng, Ur ~~LOC~~: NOT DETECTED
Cocaine Metabolite,Ur ~~LOC~~: POSITIVE — AB
MDMA (Ecstasy)Ur Screen: NOT DETECTED
METHADONE SCREEN, URINE: NOT DETECTED
Opiate, Ur Screen: POSITIVE — AB
Phencyclidine (PCP) Ur S: NOT DETECTED
TRICYCLIC, UR SCREEN: NOT DETECTED

## 2015-05-12 LAB — ETHANOL

## 2015-05-12 LAB — TROPONIN I: Troponin I: <0.03 ng/mL (ref <0.031)

## 2015-05-12 MED ORDER — NALOXONE HCL 2 MG/2ML IJ SOSY
2.0000 mg | PREFILLED_SYRINGE | Freq: Once | INTRAMUSCULAR | Status: AC
  Administered 2015-05-12: 2 mg via INTRAVENOUS
  Filled 2015-05-12: qty 2

## 2015-05-12 NOTE — Discharge Instructions (Signed)
Polysubstance Abuse °When people abuse more than one drug or type of drug it is called polysubstance or polydrug abuse. For example, many smokers also drink alcohol. This is one form of polydrug abuse. Polydrug abuse also refers to the use of a drug to counteract an unpleasant effect produced by another drug. It may also be used to help with withdrawal from another drug. People who take stimulants may become agitated. Sometimes this agitation is countered with a tranquilizer. This helps protect against the unpleasant side effects. Polydrug abuse also refers to the use of different drugs at the same time.  °Anytime drug use is interfering with normal living activities, it has become abuse. This includes problems with family and friends. Psychological dependence has developed when your mind tells you that the drug is needed. This is usually followed by physical dependence which has developed when continuing increases of drug are required to get the same feeling or "high". This is known as addiction or chemical dependency. A person's risk is much higher if there is a history of chemical dependency in the family. °SIGNS OF CHEMICAL DEPENDENCY °· You have been told by friends or family that drugs have become a problem. °· You fight when using drugs. °· You are having blackouts (not remembering what you do while using). °· You feel sick from using drugs but continue using. °· You lie about use or amounts of drugs (chemicals) used. °· You need chemicals to get you going. °· You are suffering in work performance or in school because of drug use. °· You get sick from use of drugs but continue to use anyway. °· You need drugs to relate to people or feel comfortable in social situations. °· You use drugs to forget problems. °"Yes" answered to any of the above signs of chemical dependency indicates there are problems. The longer the use of drugs continues, the greater the problems will become. °If there is a family history of  drug or alcohol use, it is best not to experiment with these drugs. Continual use leads to tolerance. After tolerance develops more of the drug is needed to get the same feeling. This is followed by addiction. With addiction, drugs become the most important part of life. It becomes more important to take drugs than participate in the other usual activities of life. This includes relating to friends and family. Addiction is followed by dependency. Dependency is a condition where drugs are now needed not just to get high, but to feel normal. °Addiction cannot be cured but it can be stopped. This often requires outside help and the care of professionals. Treatment centers are listed in the yellow pages under: Cocaine, Narcotics, and Alcoholics Anonymous. Most hospitals and clinics can refer you to a specialized care center. Talk to your caregiver if you need help. °  °This information is not intended to replace advice given to you by your health care provider. Make sure you discuss any questions you have with your health care provider. °  °Document Released: 10/13/2004 Document Revised: 05/16/2011 Document Reviewed: 02/26/2014 °Elsevier Interactive Patient Education ©2016 [employer] ° °

## 2015-05-12 NOTE — ED Notes (Addendum)
Pt presents to ED via EMS for overdose. Family called out for cardiac arrest. Family started CPR. Pt immediately awoke by 2mp narcan. Pt reports taken 2 tabs of norco. Per EMS, BP-136/78, HR-120, CBG-118. Pt reports headache all day. Pt alerts and oriented x4 at arrival to ED.

## 2015-05-12 NOTE — ED Provider Notes (Signed)
Time Seen: Approximately 2050 I have reviewed the triage notes  Chief Complaint: Drug Overdose   History of Present Illness: Heather Rangel is a 50 y.o. female patient was transported by EMS after family called concerning cardiac arrest. The patient and patient's family apparently did CPR on her chest for approximately 5 minutes. Patient woke up immediately upon to milligrams of Narcan intranasally. Patient has a history of substance abuse. Patient is somewhat awake upon her arrival and is oriented 4. She was transported here uneventfully. Patient denies any inappropriate ingestion or injection of narcotic medication. She denies being suicidal, homicidal or having hallucinations. Gait she fell on Saturday and struck the back of her head and has a headache. She denies any chest pain or shortness of breath.  Past Medical History  Diagnosis Date  . Allergy   . Arthritis   . Headache   . Hip pain   . Bipolar 1 disorder (HCC)   . Depression     Patient Active Problem List   Diagnosis Date Noted  . Arthritis of left hip 09/02/2014  . Hepatitis C 09/02/2014  . Tobacco use 09/02/2014  . Allergic rhinitis 09/02/2014    Past Surgical History  Procedure Laterality Date  . Knee arthrocentesis    . Wrist arthrocentesis      Right side    Past Surgical History  Procedure Laterality Date  . Knee arthrocentesis    . Wrist arthrocentesis      Right side    Current Outpatient Rx  Name  Route  Sig  Dispense  Refill  . buPROPion (WELLBUTRIN SR) 150 MG 12 hr tablet   Oral   Take 1 tablet (150 mg total) by mouth 2 (two) times daily. Take 150 mg once daily at bedtime for 3 days. Then take 150mg  twice daily.   30 tablet   2   . fluticasone (FLONASE) 50 MCG/ACT nasal spray   Each Nare   Place 2 sprays into both nostrils daily.   16 g   11   . HYDROcodone-acetaminophen (NORCO/VICODIN) 5-325 MG per tablet   Oral   Take 1 tablet by mouth at bedtime.   30 tablet   0   .  loratadine (CLARITIN) 10 MG tablet   Oral   Take 1 tablet (10 mg total) by mouth daily.   30 tablet   11   . meloxicam (MOBIC) 15 MG tablet   Oral   Take 1 tablet (15 mg total) by mouth daily.   30 tablet   1     Allergies:  Review of patient's allergies indicates no known allergies.  Family History: Family History  Problem Relation Age of Onset  . Cancer Mother     lung cancer  . Heart disease Father   . Alcohol abuse Father   . Cancer Paternal Grandmother   . Cancer Paternal Grandfather     Social History: Social History  Substance Use Topics  . Smoking status: Light Tobacco Smoker  . Smokeless tobacco: None  . Alcohol Use: Yes     Review of Systems:   10 point review of systems was performed and was otherwise negative:  Constitutional: No fever Eyes: No visual disturbances ENT: No sore throat, ear pain Cardiac: No chest pain Respiratory: No shortness of breath, wheezing, or stridor Abdomen: No abdominal pain, no vomiting, No diarrhea Endocrine: No weight loss, No night sweats Extremities: No peripheral edema, cyanosis Skin: No rashes, easy bruising Neurologic: No focal weakness, trouble with  speech or swollowing Urologic: No dysuria, Hematuria, or urinary frequency   Physical Exam:  ED Triage Vitals  Enc Vitals Group     BP 05/12/15 2048 117/85 mmHg     Pulse Rate 05/12/15 2048 90     Resp 05/12/15 2048 11     Temp 05/12/15 2048 99.1 F (37.3 C)     Temp Source 05/12/15 2048 Oral     SpO2 05/12/15 2048 96 %     Weight --      Height --      Head Cir --      Peak Flow --      Pain Score 05/12/15 2050 10     Pain Loc --      Pain Edu? --      Excl. in GC? --     General: Awake , Alert , and Oriented times 3; patient sleepy but arousable and appears to be protecting her airway Head: Normal cephalic , atraumatic Eyes: Pupils equal , round, reactive to light Nose/Throat: No nasal drainage, patent upper airway without erythema or exudate.   Neck: Supple, Full range of motion, No anterior adenopathy or palpable thyroid masses Lungs: Clear to ascultation without wheezes , rhonchi, or rales Heart: Regular rate, regular rhythm without murmurs , gallops , or rubs Abdomen: Soft, non tender without rebound, guarding , or rigidity; bowel sounds positive and symmetric in all 4 quadrants. No organomegaly .        Extremities: 2 plus symmetric pulses. No edema, clubbing or cyanosis Neurologic: normal ambulation, Motor symmetric without deficits, sensory intact Skin: warm, dry, no rashes ; old needle marks seen on occasional areas on superficial veins in both upper extremities   Labs:   All laboratory work was reviewed including any pertinent negatives or positives listed below:  Labs Reviewed  TROPONIN I  COMPREHENSIVE METABOLIC PANEL  CBC WITH DIFFERENTIAL/PLATELET  URINALYSIS COMPLETEWITH MICROSCOPIC ([facility] ONLY)  URINE DRUG SCREEN, QUALITATIVE ([facility] ONLY)   troponin was negative and urine drug screen was positive for amphetamines, cocaine, and opioids  EKG:  ED ECG REPORT I, Jennye Moccasin, the attending physician, personally viewed and interpreted this ECG.  Date: 05/12/2015 EKG Time: 2044 Rate: 94 Rhythm: normal sinus rhythm QRS Axis: normal Intervals: Left anterior fascicular block ST/T Wave abnormalities: normal Conduction Disturbances: none Narrative Interpretation: unremarkable No acute ischemic changes  Radiology:    CT Head Wo Contrast (Final result) Result time: 05/12/15 21:23:46   Final result by Rad Results In Interface (05/12/15 21:23:46)   Narrative:   CLINICAL DATA: Head trauma.  EXAM: CT HEAD WITHOUT CONTRAST  TECHNIQUE: Contiguous axial images were obtained from the base of the skull through the vertex without intravenous contrast.  COMPARISON: None.  FINDINGS: Ventricle size is normal. Negative for acute or chronic infarction. Negative for hemorrhage or fluid collection. Negative for  mass or edema. No shift of the midline structures.  Calvarium is intact.  IMPRESSION: Normal   Electronically Signed By: Marlan Palau M.D. On: 05/12/2015 21:23          DG Chest 2 View (Final result) Result time: 05/12/15 21:10:56   Final result by Rad Results In Interface (05/12/15 21:10:56)   Narrative:   CLINICAL DATA: Pt presents to ED via EMS for overdose. Family called out for cardiac arrest at 8pm Family started CPR. Pt immediately awoke by 2mp narcan.  EXAM: CHEST 2 VIEW  COMPARISON: 10/21/2014  FINDINGS: The heart size and mediastinal contours are within normal limits.  Both lungs are clear. No pleural effusion or pneumothorax. The visualized skeletal structures are unremarkable.  IMPRESSION: No active cardiopulmonary disease.   Electronically Signed By: Amie Portland M.D. On: 05/12/2015 21:10                I personally reviewed the radiologic studies     ED Course: Patient was placed on a continuous cardiac monitor and pulse oximetry. Patient had received Narcan in the field with symptomatic improvement. During her ED course she started becoming very drowsy and her pulse ox dropped and she was difficult to arouse and she had a repeat dose of Narcan at 2 mg IV. An awoke easily and now is requesting drug rehabilitation for heroin and cocaine. The patient had initially denied any narcotic ingestion. Her head CT was negative for her remote head trauma that occurred on Saturday.  Patient was placed for a consult with TTS services for rehabilitation.    Assessment:  Acute narcotic overdose History of polysubstance abuse      Plan: * Patient is requesting substance abuse counseling and rehabilitation*            Jennye Moccasin, MD 05/12/15 2300

## 2015-05-12 NOTE — ED Notes (Signed)
Pt immediately awaken after narcan administration. Pt verbally aggressive.

## 2015-05-12 NOTE — ED Notes (Signed)
Pt requesting detox from cocaine/heroin.

## 2015-05-12 NOTE — ED Notes (Signed)
Pt falling asleep and spO2-85, Dr. Huel CoteQuigley notified, ordered 2mg  narcan IV.

## 2015-12-14 ENCOUNTER — Emergency Department (HOSPITAL_COMMUNITY)
Admission: EM | Admit: 2015-12-14 | Discharge: 2015-12-14 | Disposition: A | Discharge status: Home / Self Care | Payer: Self-pay | Attending: Physician Assistant | Admitting: Physician Assistant

## 2015-12-14 ENCOUNTER — Encounter (HOSPITAL_COMMUNITY): Payer: Self-pay | Admitting: *Deleted

## 2015-12-14 DIAGNOSIS — M25552 Pain in left hip: Secondary | ICD-10-CM | POA: Insufficient documentation

## 2015-12-14 DIAGNOSIS — Y929 Unspecified place or not applicable: Secondary | ICD-10-CM | POA: Insufficient documentation

## 2015-12-14 DIAGNOSIS — F17200 Nicotine dependence, unspecified, uncomplicated: Secondary | ICD-10-CM | POA: Insufficient documentation

## 2015-12-14 DIAGNOSIS — Y939 Activity, unspecified: Secondary | ICD-10-CM | POA: Insufficient documentation

## 2015-12-14 DIAGNOSIS — Y999 Unspecified external cause status: Secondary | ICD-10-CM | POA: Insufficient documentation

## 2015-12-14 DIAGNOSIS — X501XXA Overexertion from prolonged static or awkward postures, initial encounter: Secondary | ICD-10-CM | POA: Insufficient documentation

## 2015-12-14 MED ORDER — HYDROCODONE-ACETAMINOPHEN 5-325 MG PO TABS
1.0000 | ORAL_TABLET | Freq: Once | ORAL | Status: AC
Start: 2015-12-14 — End: 2015-12-14
  Administered 2015-12-14: 1 via ORAL
  Filled 2015-12-14: qty 1

## 2015-12-14 MED ORDER — MELOXICAM 15 MG PO TABS: 15.0000 mg | ORAL_TABLET | Freq: Every day | ORAL | 0 refills | Status: DC

## 2015-12-14 MED ORDER — HYDROCODONE-ACETAMINOPHEN 5-325 MG PO TABS: 1.0000 | ORAL_TABLET | Freq: Four times a day (QID) | ORAL | 0 refills | Status: DC | PRN

## 2015-12-14 NOTE — ED Notes (Signed)
Declined W/C at D/C and was escorted to lobby by RN. 

## 2015-12-14 NOTE — ED Provider Notes (Signed)
[facility]-EMERGENCY DEPT Provider Note   CSN: [id number] Arrival date & time: 12/14/15  1015 By signing my name below, I, Bridgette Habermann, attest that this documentation has been prepared under the direction and in the presence of [facility], PA-C. Electronically Signed: Bridgette Habermann, ED Scribe. 12/14/15. 11:54 AM.  History   Chief Complaint Chief Complaint  Patient presents with  . Leg Pain   HPI Comments: Heather Rangel is a 50 y.o. female with h/o sciatica who presents to the Emergency Department complaining of acute on chronic, 9/10 left hip pain s/p mechanical injury one day ago. Pt states she slipped on a curb and twisted her hip and has had increased pain since. She notes she has h/o hip pain and sciatica and notes that her pain at this time is consistent with her flares. She is from [place] and prescribed Hydrocodone and Meloxicam when she has these flares. She called her doctor in [place] who said he was unable to call in these prescriptions across state lines and that she would need to be seen in [place]. She denies any additional injuries. She further denies numbness, paresthesia, fever, or any other associated symptoms.   The history is provided by the patient. No language interpreter was used.    Past Medical History:  Diagnosis Date  . Allergy   . Arthritis   . Bipolar 1 disorder (HCC)   . Depression   . Headache   . Hip pain     Patient Active Problem List   Diagnosis Date Noted  . Arthritis of left hip 09/02/2014  . Hepatitis C 09/02/2014  . Tobacco use 09/02/2014  . Allergic rhinitis 09/02/2014    Past Surgical History:  Procedure Laterality Date  . KNEE ARTHROCENTESIS    . WRIST ARTHROCENTESIS     Right side    OB History    No data available       Home Medications    Prior to Admission medications   Medication Sig Start Date End Date Taking? Authorizing Provider  buPROPion (WELLBUTRIN SR) 150 MG 12 hr tablet Take 1 tablet (150 mg total) by mouth  2 (two) times daily. Take 150 mg once daily at bedtime for 3 days. Then take 150mg  twice daily. 09/02/14   Amy Lauren Krebs, NP  fluticasone (FLONASE) 50 MCG/ACT nasal spray Place 2 sprays into both nostrils daily. 09/02/14   Amy Rusty Aus, NP  HYDROcodone-acetaminophen (NORCO/VICODIN) 5-325 MG per tablet Take 1 tablet by mouth at bedtime. 09/02/14   Amy Rusty Aus, NP  loratadine (CLARITIN) 10 MG tablet Take 1 tablet (10 mg total) by mouth daily. 09/02/14   Amy Rusty Aus, NP  meloxicam (MOBIC) 15 MG tablet Take 1 tablet (15 mg total) by mouth daily. 09/02/14   Amy Rusty Aus, NP    Family History Family History  Problem Relation Age of Onset  . Cancer Mother     lung cancer  . Heart disease Father   . Alcohol abuse Father   . Cancer Paternal Grandmother   . Cancer Paternal Grandfather     Social History Social History  Substance Use Topics  . Smoking status: Light Tobacco Smoker  . Smokeless tobacco: Never Used  . Alcohol use Yes     Allergies   Review of patient's allergies indicates no known allergies.   Review of Systems Review of Systems  Constitutional: Negative for fever.  Musculoskeletal: Positive for arthralgias.  Neurological: Negative for numbness.     Physical Exam Updated  Vital Signs BP 138/89 (BP Location: Right Arm)   Pulse 78   Temp 98.1 F (36.7 C) (Oral)   Resp 17   Ht 5\' 10"  (1.778 m)   Wt 150 lb (68 kg)   SpO2 100%   BMI 21.52 kg/m   Physical Exam  Constitutional: She is oriented to person, place, and time. She appears well-developed and well-nourished. No distress.  HENT:  Head: Normocephalic and atraumatic.  Cardiovascular: Normal rate, regular rhythm, normal heart sounds and intact distal pulses.  Exam reveals no gallop and no friction rub.   No murmur heard. Pulmonary/Chest: Effort normal and breath sounds normal. No respiratory distress. She has no wheezes. She has no rales. She exhibits no tenderness.  Abdominal: Soft. Bowel  sounds are normal. She exhibits no distension. There is no tenderness.  Musculoskeletal: She exhibits tenderness. She exhibits no edema.  TTP of left hip and left low back. No overlying skin changes or swelling. Full ROM. SLR negative bilaterally for radicular symptoms. 5/5 muscle strength of RLE, 4/5 muscle strength of LLE.   Neurological: She is alert and oriented to person, place, and time.  Bilateral lower extremities neurovascularly intact.   Skin: Skin is warm and dry.  Nursing note and vitals reviewed.    ED Treatments / Results  DIAGNOSTIC STUDIES: Oxygen Saturation is 100% on RA, normal by my interpretation.    COORDINATION OF CARE: 11:53 AM Discussed treatment plan with pt at bedside which includes pain / anti-inflammatory Rx and pt agreed to plan.  Labs (all labs ordered are listed, but only abnormal results are displayed) Labs Reviewed - No data to display  EKG  EKG Interpretation None       Radiology No results found.  Procedures Procedures (including critical care time)  Medications Ordered in ED Medications - No data to display   Initial Impression / Assessment and Plan / ED Course  I have reviewed the triage vital signs and the nursing notes.  Pertinent labs & imaging results that were available during my care of the patient were reviewed by me and considered in my medical decision making (see chart for details).  Clinical Course   Heather Rangel is a 50 y.o. female who presents to ED for acute on chronic left hip pain. She is from [place] here visiting daughter and is typically prescribed Norco and meloxicam by her provider out of state. Discussed pros and cons of x-rays and patient with like to hold off on imaging at this time which I believe is appropriate. We will treat with short course of pain medication and meloxicam as this is worked for her in the past. She is ambulatory in the ED without difficulty. Return precautions and follow-up care  discussed. All questions answered.  [place] Substance Data base was consulted with no prescriptions listed.    Final Clinical Impressions(s) / ED Diagnoses   Final diagnoses:  None   I personally performed the services described in this documentation, which was scribed in my presence. The recorded information has been reviewed and is accurate.   New Prescriptions New Prescriptions   No medications on file     [facility] CharlesJaime Pilcher Verdell Dykman, PA-C 12/14/15 1314    Courteney Randall AnLyn Mackuen, MD 12/15/15 [phone]

## 2015-12-14 NOTE — ED Triage Notes (Signed)
PT reports a past pain in lower back that radiates into Lt. Leg.  Pain now worse since last night. Pt has been sleeping in a chair because her daughter is a Pt on 2 heart.

## 2015-12-14 NOTE — ED Triage Notes (Signed)
Pt states hx of sciatica.  Slipped on curb and now has increased pain to L hip.

## 2015-12-14 NOTE — Discharge Instructions (Signed)
Please follow up with your physician for discussion of today's diagnosis.  Return to ER for new or worsening symptoms, any additional concerns.

## 2016-01-02 ENCOUNTER — Encounter (HOSPITAL_COMMUNITY): Payer: Self-pay | Admitting: *Deleted

## 2016-01-02 ENCOUNTER — Emergency Department (HOSPITAL_COMMUNITY)
Admission: EM | Admit: 2016-01-02 | Discharge: 2016-01-02 | Disposition: A | Discharge status: Home / Self Care | Payer: Self-pay | Attending: Emergency Medicine | Admitting: Emergency Medicine

## 2016-01-02 DIAGNOSIS — M25552 Pain in left hip: Secondary | ICD-10-CM

## 2016-01-02 DIAGNOSIS — F17200 Nicotine dependence, unspecified, uncomplicated: Secondary | ICD-10-CM | POA: Insufficient documentation

## 2016-01-02 DIAGNOSIS — M5442 Lumbago with sciatica, left side: Secondary | ICD-10-CM | POA: Insufficient documentation

## 2016-01-02 DIAGNOSIS — G8929 Other chronic pain: Secondary | ICD-10-CM | POA: Insufficient documentation

## 2016-01-02 MED ORDER — PREDNISONE 10 MG PO TABS: 50.0000 mg | ORAL_TABLET | Freq: Every day | ORAL | 0 refills | Status: AC

## 2016-01-02 MED ORDER — HYDROCODONE-ACETAMINOPHEN 5-325 MG PO TABS: 1.0000 | ORAL_TABLET | Freq: Four times a day (QID) | ORAL | 0 refills | Status: AC | PRN

## 2016-01-02 MED ORDER — HYDROCODONE-ACETAMINOPHEN 5-325 MG PO TABS
1.0000 | ORAL_TABLET | Freq: Once | ORAL | Status: AC
  Administered 2016-01-02: 1 via ORAL
  Filled 2016-01-02: qty 1

## 2016-01-02 MED ORDER — PREDNISONE 20 MG PO TABS
50.0000 mg | ORAL_TABLET | Freq: Once | ORAL | Status: AC
  Administered 2016-01-02: 50 mg via ORAL
  Filled 2016-01-02: qty 3

## 2016-01-02 NOTE — ED Provider Notes (Signed)
[facility] DEPT Provider Note   CSN: [id number] Arrival date & time: 01/02/16  2152  By signing my name below, I, Rosario AdieWilliam Andrew Hiatt, attest that this documentation has been prepared under the direction and in the presence of Mattie MarlinJessica Evlyn Amason, PA-C.  Electronically Signed: Rosario AdieWilliam Andrew Hiatt, ED Scribe. 01/02/16. 11:04 PM.  History   Chief Complaint Chief Complaint  Patient presents with  . Hip Pain   The history is provided by the patient and medical records. No language interpreter was used.    HPI Comments: Heather Rangel is a 50 y.o. female with a PMHx of sciatica, who presents to the Emergency Department complaining of acute on chronic, 8/10 left lower back/hip pain onset several years ago, worsening over the past day. She describes her pain as sharp, and states that radiates down into her left leg, knee, and foot. She additionally notes moderate tingling in her left foot with the radiation of her pain. Pt states that she has tolerated a taper of steroids in the past for this issue with complete relief, and her last course was approximately 6 months ago. Pt has been taking Vicodin, Meloxicam, and alternating ice and heat with minimal relief of her current pain. Her pain is exacerbated with general movement of the left leg and with ambulation. Pt previously has an MRI for this issue approximately 10 months ago which was negative, per pt. No h/o DM. Denies fever, chills, bowel/bladder incontinence, saddle anaesthesia/paraesthesias, or any other associated symptoms.   PCP: Filbert BertholdAmy L Krebs, NP  Past Medical History:  Diagnosis Date  . Allergy   . Arthritis   . Bipolar 1 disorder (HCC)   . Depression   . Headache   . Hip pain    Patient Active Problem List   Diagnosis Date Noted  . Arthritis of left hip 09/02/2014  . Hepatitis C 09/02/2014  . Tobacco use 09/02/2014  . Allergic rhinitis 09/02/2014   Past Surgical History:  Procedure Laterality Date  . KNEE ARTHROCENTESIS     . WRIST ARTHROCENTESIS     Right side   OB History    No data available     Home Medications    Prior to Admission medications   Medication Sig Start Date End Date Taking? Authorizing Provider  buPROPion (WELLBUTRIN SR) 150 MG 12 hr tablet Take 1 tablet (150 mg total) by mouth 2 (two) times daily. Take 150 mg once daily at bedtime for 3 days. Then take 150mg  twice daily. 09/02/14   Amy Lauren Krebs, NP  fluticasone (FLONASE) 50 MCG/ACT nasal spray Place 2 sprays into both nostrils daily. 09/02/14   Amy Rusty AusLauren Krebs, NP  HYDROcodone-acetaminophen (NORCO/VICODIN) 5-325 MG tablet Take 1-2 tablets by mouth every 6 (six) hours as needed for severe pain. 01/02/16   Jerre SimonJessica L Rogena Deupree, PA  loratadine (CLARITIN) 10 MG tablet Take 1 tablet (10 mg total) by mouth daily. 09/02/14   Amy Rusty AusLauren Krebs, NP  meloxicam (MOBIC) 15 MG tablet Take 1 tablet (15 mg total) by mouth daily. 12/14/15   Jaime Pilcher Ward, PA-C  predniSONE (DELTASONE) 10 MG tablet Take 5 tablets (50 mg total) by mouth daily. 01/02/16   Jerre SimonJessica L Corneisha Alvi, PA   Family History Family History  Problem Relation Age of Onset  . Cancer Mother     lung cancer  . Heart disease Father   . Alcohol abuse Father   . Cancer Paternal Grandmother   . Cancer Paternal Grandfather    Social History Social History  Substance Use Topics  . Smoking status: Light Tobacco Smoker  . Smokeless tobacco: Never Used  . Alcohol use Yes   Allergies   Review of patient's allergies indicates no known allergies.  Review of Systems Review of Systems  Constitutional: Negative for chills and fever.  Respiratory: Negative for shortness of breath.   Cardiovascular: Negative for chest pain.  Gastrointestinal: Negative for nausea and vomiting.  Musculoskeletal: Positive for arthralgias (left hip), back pain (left lower), gait problem and myalgias.  Neurological:       Negative for saddle anaesthesia/paraesthesias. Negative for bowel/bladder incontinence.  All  other systems reviewed and are negative.  Physical Exam Updated Vital Signs BP 138/88 (BP Location: Right Arm)   Pulse 91   Temp 97.7 F (36.5 C) (Oral)   Resp 16   Ht 5\' 10"  (1.778 m)   Wt 149 lb 7 oz (67.8 kg)   SpO2 100%   BMI 21.44 kg/m   Physical Exam  Constitutional: She appears well-developed and well-nourished. No distress.  HENT:  Head: Normocephalic and atraumatic.  Eyes: Conjunctivae are normal.  Pulmonary/Chest: Effort normal. No respiratory distress.  Musculoskeletal: Normal range of motion.  Positive bilateral straight leg raise. Normal strength with dorsi and plantar flexion of the bilateral feet. 2+ PT pulses bilaterally. No stepoff, deformity, or crepitus of the midline T or L spine. Mild left sided paraspinal tenderness. SI joint tenderness on the left. Sensation is intact. Neurovascularly intact distally.   Neurological: She is alert. Coordination normal.  Skin: Skin is warm and dry. She is not diaphoretic.  Psychiatric: She has a normal mood and affect. Her behavior is normal.  Nursing note and vitals reviewed.  ED Treatments / Results  DIAGNOSTIC STUDIES: Oxygen Saturation is 100% on RA, normal by my interpretation.   COORDINATION OF CARE: 11:04 PM-Discussed next steps with pt. Pt verbalized understanding and is agreeable with the plan.   Labs (all labs ordered are listed, but only abnormal results are displayed) Labs Reviewed - No data to display  EKG  EKG Interpretation None      Radiology No results found.  Procedures Procedures   Medications Ordered in ED Medications  predniSONE (DELTASONE) tablet 50 mg (not administered)    Initial Impression / Assessment and Plan / ED Course  I have reviewed the triage vital signs and the nursing notes.  Pertinent labs & imaging results that were available during my care of the patient were reviewed by me and considered in my medical decision making (see chart for details).  Clinical Course    Patient is a 50yo female with a hx of sciatica and chronic left hip pain who presents to the ED with back pain. No neurological deficits appreciated. Patient is ambulatory. No warning symptoms of back pain including: fecal incontinence, urinary retention or overflow incontinence, night sweats, waking from sleep with back pain, unexplained fevers or weight loss, h/o cancer, IVDU, recent trauma. No concern for cauda equina, epidural abscess, or other serious cause of back pain. Conservative measures such as rest, ice/heat and pain medicine indicated with PCP follow-up if no improvement with conservative management. Pt is comfortable with above plan and is stable for discharge at this time. All questions were answered prior to disposition. Strict return precautions for f/u into the ED were discussed.   Final Clinical Impressions(s) / ED Diagnoses   Final diagnoses:  Chronic left-sided low back pain with left-sided sciatica  Left hip pain   New Prescriptions New Prescriptions   HYDROCODONE-ACETAMINOPHEN (  NORCO/VICODIN) 5-325 MG TABLET    Take 1-2 tablets by mouth every 6 (six) hours as needed for severe pain.   PREDNISONE (DELTASONE) 10 MG TABLET    Take 5 tablets (50 mg total) by mouth daily.   I personally performed the services described in this documentation, which was scribed in my presence. The recorded information has been reviewed and is accurate.       Jerre Simon, PA 01/02/16 2319    Gerhard Munch, MD 01/05/16 [phone]

## 2016-01-02 NOTE — Discharge Instructions (Signed)
Take the prednisone as prescribed. Do not take the meloxicam with the prednisone. You may restart the meloxicam after the course of prednisone. Continue to ice your back. Take the Vicodin as prescribed as needed for pain. Follow-up with your primary care provider or orthopedics in 3-4 days if symptoms are not improving. Return immediately to the emergency department if you experience weakness in your left leg, numbness, loss of bowel or bladder function, inability to urinate, fevers or any other concerning symptoms.

## 2016-01-02 NOTE — ED Triage Notes (Signed)
The pt has chronic hip pain  But for the past few weeks her pain in her lt hip has been worse.  She was sen here on the 9th but she was not given a ortho referral and she has called several orhto doctors and none will take her without a referral.  She is staying with her daughter who is a pt upstairs.  Her entire lt leg hurts and her sciatic nerve is hurting more

## 2016-01-13 ENCOUNTER — Emergency Department (HOSPITAL_COMMUNITY)
Admission: EM | Admit: 2016-01-13 | Discharge: 2016-01-13 | Disposition: A | Discharge status: Home / Self Care | Payer: Self-pay | Attending: Emergency Medicine | Admitting: Emergency Medicine

## 2016-01-13 ENCOUNTER — Encounter (HOSPITAL_COMMUNITY): Payer: Self-pay | Admitting: Emergency Medicine

## 2016-01-13 DIAGNOSIS — Z79899 Other long term (current) drug therapy: Secondary | ICD-10-CM | POA: Insufficient documentation

## 2016-01-13 DIAGNOSIS — M25552 Pain in left hip: Secondary | ICD-10-CM | POA: Insufficient documentation

## 2016-01-13 DIAGNOSIS — F17200 Nicotine dependence, unspecified, uncomplicated: Secondary | ICD-10-CM | POA: Insufficient documentation

## 2016-01-13 NOTE — ED Provider Notes (Signed)
[facility] Provider Note   CSN: [id number] Arrival date & time: 01/13/16  1512  By signing my name below, I, Freida Busmaniana Omoyeni, attest that this documentation has been prepared under the direction and in the presence of non-physician practitioner, Eyvonne MechanicJeffrey Zeenat Jeanbaptiste, PA-C. Electronically Signed: Freida Busmaniana Omoyeni, Scribe. 01/13/2016. 3:48 PM.   History   Chief Complaint Chief Complaint  Patient presents with  . Hip Pain     The history is provided by the patient. No language interpreter was used.    HPI Comments:  Heather Rangel is a 50 y.o. female who presents to the Emergency Department complaining of continued left hip pain. Her pain is chronic due to sciatica; states he SI joint is damaged. She notes increased pain in the last few days. Pt was seen here on 01/02/16 for the same and was discharged with ortho referral. She states she has an appointment scheduled with Dr. Roda ShuttersXu on 02/17/16. She is requesting a refill of Vicodin given during that visit. She denies numbness/weakness.   Pt was given a 90 day supply  of Tylenol 3 on September 29, 2015 per [place] controlled substance recoding system.   Past Medical History:  Diagnosis Date  . Allergy   . Arthritis   . Bipolar 1 disorder (HCC)   . Depression   . Headache   . Hip pain     Patient Active Problem List   Diagnosis Date Noted  . Arthritis of left hip 09/02/2014  . Hepatitis C 09/02/2014  . Tobacco use 09/02/2014  . Allergic rhinitis 09/02/2014    Past Surgical History:  Procedure Laterality Date  . KNEE ARTHROCENTESIS    . WRIST ARTHROCENTESIS     Right side    OB History    No data available       Home Medications    Prior to Admission medications   Medication Sig Start Date End Date Taking? Authorizing Provider  buPROPion (WELLBUTRIN SR) 150 MG 12 hr tablet Take 1 tablet (150 mg total) by mouth 2 (two) times daily. Take 150 mg once daily at bedtime for 3 days. Then take 150mg  twice daily. 09/02/14   Amy Lauren  Krebs, NP  fluticasone (FLONASE) 50 MCG/ACT nasal spray Place 2 sprays into both nostrils daily. 09/02/14   Amy Rusty AusLauren Krebs, NP  HYDROcodone-acetaminophen (NORCO/VICODIN) 5-325 MG tablet Take 1-2 tablets by mouth every 6 (six) hours as needed for severe pain. 01/02/16   Jerre SimonJessica L Focht, PA  loratadine (CLARITIN) 10 MG tablet Take 1 tablet (10 mg total) by mouth daily. 09/02/14   Amy Rusty AusLauren Krebs, NP  meloxicam (MOBIC) 15 MG tablet Take 1 tablet (15 mg total) by mouth daily. 12/14/15   Jaime Pilcher Ward, PA-C  predniSONE (DELTASONE) 10 MG tablet Take 5 tablets (50 mg total) by mouth daily. 01/02/16   Jerre SimonJessica L Focht, PA    Family History Family History  Problem Relation Age of Onset  . Cancer Mother     lung cancer  . Heart disease Father   . Alcohol abuse Father   . Cancer Paternal Grandmother   . Cancer Paternal Grandfather     Social History Social History  Substance Use Topics  . Smoking status: Light Tobacco Smoker  . Smokeless tobacco: Never Used  . Alcohol use Yes     Allergies   Patient has no known allergies.   Review of Systems Review of Systems  Musculoskeletal:       +Left hip pain  Neurological: Negative for weakness  and numbness.     Physical Exam Updated Vital Signs BP 109/73 (BP Location: Left Arm)   Pulse 107   Temp 98.6 F (37 C) (Oral)   Resp 20   Ht 5\' 10"  (1.778 m)   Wt 68 kg   SpO2 98%   BMI 21.52 kg/m   Physical Exam  Constitutional: She is oriented to person, place, and time. She appears well-developed and well-nourished. No distress.  HENT:  Head: Normocephalic and atraumatic.  Eyes: Conjunctivae are normal.  Cardiovascular: Normal rate.   Pulmonary/Chest: Effort normal.  Abdominal: She exhibits no distension.  Musculoskeletal:  Left lower lumbar soft tissue tenderness Strength and sensation intact to distal extremities.   Neurological: She is alert and oriented to person, place, and time.  Skin: Skin is warm and dry.    Psychiatric: She has a normal mood and affect.  Nursing note and vitals reviewed.    ED Treatments / Results  DIAGNOSTIC STUDIES:  Oxygen Saturation is 98% on RA, normal by my interpretation.    COORDINATION OF CARE:  3:40 PM Discussed treatment plan with pt at bedside and pt agreed to plan.   Labs (all labs ordered are listed, but only abnormal results are displayed) Labs Reviewed - No data to display  EKG  EKG Interpretation None       Radiology No results found.  Procedures Procedures (including critical care time)  Medications Ordered in ED Medications - No data to display   Initial Impression / Assessment and Plan / ED Course  I have reviewed the triage vital signs and the nursing notes.  Pertinent labs & imaging results that were available during my care of the patient were reviewed by me and considered in my medical decision making (see chart for details).  Clinical Course     Labs:   Imaging:   Consults:   Therapeutics:   Discharge Meds:  Assessment/Plan:   Patient presents with left hip pain. This is chronic in nature. Patient has several episodes of drug overdose, reported injecting oxycodone and is cracked.  It would not be appropriate to give this patient any narcotic pain medication, I informed her that she would need to follow up with orthopedics for continued management.  Advised to follow up with PCP or try to be seen earlier by Ortho. Return precautions given, patient verbalized understanding and agreement for today's plan had no further questions or concerns at time of discharge.    Final Clinical Impressions(s) / ED Diagnoses   Final diagnoses:  Acute pain of left hip    New Prescriptions Discharge Medication List as of 01/13/2016  3:48 PM    I personally performed the services described in this documentation, which was scribed in my presence. The recorded information has been reviewed and is accurate.     Eyvonne MechanicJeffrey Weltha Cathy,  PA-C 01/13/16 1614    Benjiman CoreNathan Pickering, MD 01/14/16 [phone]

## 2016-01-13 NOTE — ED Triage Notes (Signed)
Pt states she was just seen on oct 28th for chronic SI joint pain. Pt was given pain medication but has run out of meds. Pt continues to have pain. Requesting something to help her make it until she can see Dr. Magnus IvanBlackman.

## 2016-01-13 NOTE — ED Notes (Signed)
Pt reports chronic L sciatica.  States she has been staying with her daughter upstairs in the hospital and might have aggravated it.  Pt is requesting more pain meds to last until she sees orthopedic MD Dec. 13th.  States she was given pain meds the last time she was here.  Pt is ambulatory without difficulty.

## 2016-01-13 NOTE — Discharge Instructions (Signed)
Please read attached information. If you experience any new or worsening signs or symptoms please return to the emergency room for evaluation. Please follow-up with your primary care provider or specialist as discussed.  °

## 2016-01-13 NOTE — ED Notes (Signed)
Pt left without signing and without her d/c instructions

## 2016-01-18 ENCOUNTER — Ambulatory Visit (INDEPENDENT_AMBULATORY_CARE_PROVIDER_SITE_OTHER): Payer: Self-pay

## 2016-01-18 ENCOUNTER — Ambulatory Visit (INDEPENDENT_AMBULATORY_CARE_PROVIDER_SITE_OTHER): Payer: Self-pay | Admitting: Orthopaedic Surgery

## 2016-01-18 ENCOUNTER — Encounter (INDEPENDENT_AMBULATORY_CARE_PROVIDER_SITE_OTHER): Payer: Self-pay | Admitting: Orthopaedic Surgery

## 2016-01-18 DIAGNOSIS — G8929 Other chronic pain: Secondary | ICD-10-CM

## 2016-01-18 DIAGNOSIS — M25552 Pain in left hip: Secondary | ICD-10-CM

## 2016-01-18 DIAGNOSIS — M545 Low back pain: Secondary | ICD-10-CM

## 2016-01-18 MED ORDER — MELOXICAM 15 MG PO TABS: 15.0000 mg | ORAL_TABLET | Freq: Every day | ORAL | 2 refills | Status: DC

## 2016-01-18 MED ORDER — MELOXICAM 15 MG PO TABS: 15.0000 mg | ORAL_TABLET | Freq: Every day | ORAL | 2 refills | Status: AC

## 2016-01-18 NOTE — Addendum Note (Signed)
Addended by: Albertina ParrGARCIA, Tahjay Binion on: 01/18/2016 02:06 PM   Modules accepted: Orders

## 2016-01-18 NOTE — Progress Notes (Signed)
Office Visit Note   Patient: Heather Rangel           Date of Birth: 07/21/1965           MRN: [id number] Visit Date: 01/18/2016              Requested by: [name], NP No address on file PCP: Filbert BertholdAmy L Krebs, NP   Assessment & Plan: Visit Diagnoses:  1. Pain in left hip   2. Chronic bilateral low back pain without sciatica     Plan:  - patient will obtain MRI and interpretation from previous ortho office - likely L5-S1 is involved level based on xrays - referral for ESI with Dr. Alvester MorinNewton - f/u prn - mobic refilled  Follow-Up Instructions: No Follow-up on file.   Orders:  Orders Placed This Encounter  Procedures  . XR Lumbar Spine 2-3 Views  . XR HIP UNILAT W OR W/O PELVIS 1V LEFT   No orders of the defined types were placed in this encounter.     Procedures: No procedures performed   Clinical Data: No additional findings.   Subjective: Chief Complaint  Patient presents with  . Left Hip - Pain, New Patient (Initial Visit)    50 yo female with >6 yrs h/o left sciatica.  [place]  She's down here to take care of her daughter.  Has taken prednisone with partial relief.  mobic gives partial relief.  Pain radiates from left lower back down back of leg . Denies any f/c/n/v or focal motor or sensory deficits.  Pain is worse with certain movements.  norco helps.    Review of Systems  Constitutional: Negative.   HENT: Negative.   Eyes: Negative.   Respiratory: Negative.   Cardiovascular: Negative.   Endocrine: Negative.   Musculoskeletal: Negative.   Neurological: Negative.   Hematological: Negative.   Psychiatric/Behavioral: Negative.   All other systems reviewed and are negative.    Objective: Vital Signs: There were no vitals taken for this visit.  Physical Exam  Constitutional: She is oriented to person, place, and time. She appears well-developed and well-nourished.  HENT:  Head: Atraumatic.  Eyes: EOM are normal.    Neck: Neck supple.  Cardiovascular: Intact distal pulses.   Pulmonary/Chest: Effort normal.  Abdominal: Soft.  Neurological: She is alert and oriented to person, place, and time.  Skin: Skin is warm. Capillary refill takes less than 2 seconds.  Psychiatric: She has a normal mood and affect. Her behavior is normal. Judgment and thought content normal.  Nursing note and vitals reviewed.   Left Hip Exam  Left hip exam is normal.   Back Exam   Range of Motion  The patient has normal back ROM.  Muscle Strength  The patient has normal back strength.  Tests  Straight leg raise right: negative Straight leg raise left: negative  Reflexes  Patellar: normal Achilles: normal  Other  Toe Walk: normal Heel Walk: normal Sensation: normal Gait: normal       Specialty Comments:  No specialty comments available.  Imaging: No results found.   PMFS History: Patient Active Problem List   Diagnosis Date Noted  . Arthritis of left hip 09/02/2014  . Hepatitis C 09/02/2014  . Tobacco use 09/02/2014  . Allergic rhinitis 09/02/2014   Past Medical History:  Diagnosis Date  . Allergy   . Arthritis   . Bipolar 1 disorder (HCC)   . Depression   . Headache   .  Hip pain     Family History  Problem Relation Age of Onset  . Cancer Mother     lung cancer  . Heart disease Father   . Alcohol abuse Father   . Cancer Paternal Grandmother   . Cancer Paternal Grandfather     Past Surgical History:  Procedure Laterality Date  . KNEE ARTHROCENTESIS    . WRIST ARTHROCENTESIS     Right side   Social History   Occupational History  . Not on file.   Social History Main Topics  . Smoking status: Light Tobacco Smoker  . Smokeless tobacco: Never Used  . Alcohol use Yes  . Drug use: No  . Sexual activity: Not on file

## 2016-01-20 ENCOUNTER — Telehealth (INDEPENDENT_AMBULATORY_CARE_PROVIDER_SITE_OTHER): Payer: Self-pay | Admitting: *Deleted

## 2016-01-20 MED ORDER — HYDROCODONE-ACETAMINOPHEN 5-325 MG PO TABS: ORAL_TABLET | ORAL | 0 refills | Status: AC

## 2016-01-20 NOTE — Telephone Encounter (Signed)
#  20.  1 tab po bid prn.  One time only.  Please do DEA search on her too.

## 2016-01-20 NOTE — Telephone Encounter (Signed)
Pt. Requesting Norco refill.

## 2016-01-20 NOTE — Telephone Encounter (Signed)
Please advise 

## 2016-01-21 NOTE — Telephone Encounter (Signed)
Called pt to advise her rx ready for pick up at the front desk. Left message on machine

## 2016-01-22 ENCOUNTER — Telehealth (INDEPENDENT_AMBULATORY_CARE_PROVIDER_SITE_OTHER): Payer: Self-pay | Admitting: Orthopaedic Surgery

## 2016-10-10 IMAGING — CT CT HEAD W/O CM
3 series · 16 of 30 positions shown, 17 images · non-contrast
Comparison: None.

CLINICAL DATA: Head trauma.

EXAM:
CT HEAD WITHOUT CONTRAST
TECHNIQUE: Contiguous axial images were obtained from the base of the skull
through the vertex without intravenous contrast.

[Series 2: head bone · axial · 0.40mm/px · z∈[-176,-36]mm · 8 of 85 slices shown]
[im 10/85  bone]
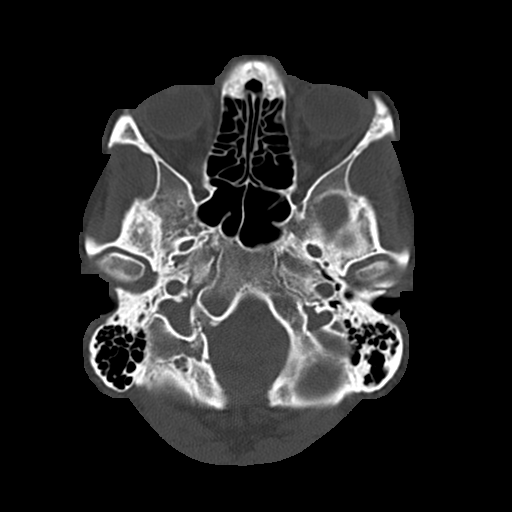
[im 20/85  bone]
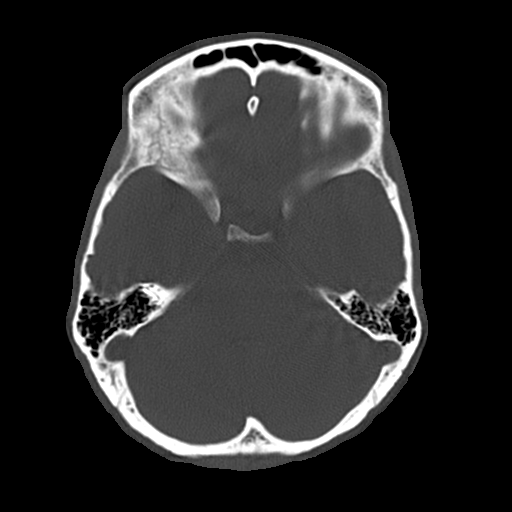
[im 30/85  bone]
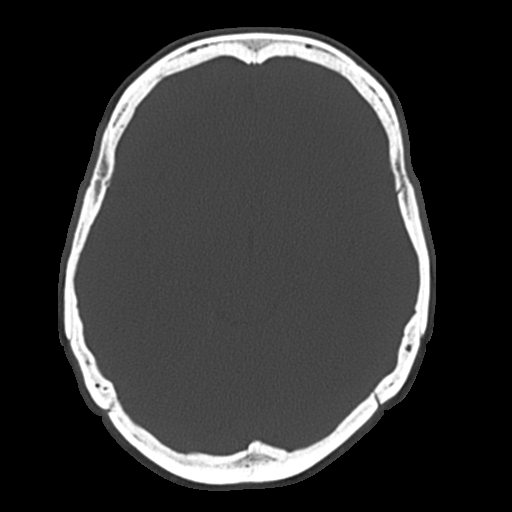
[im 40/85  bone]
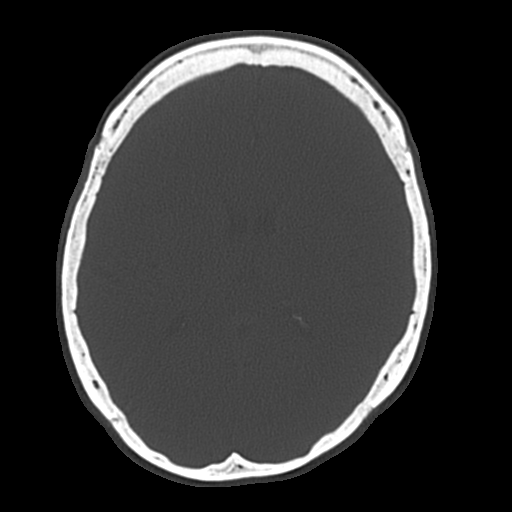
[im 50/85  bone]
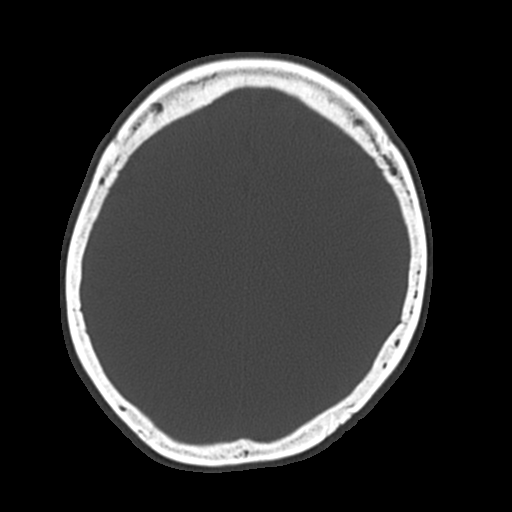
[im 60/85  bone]
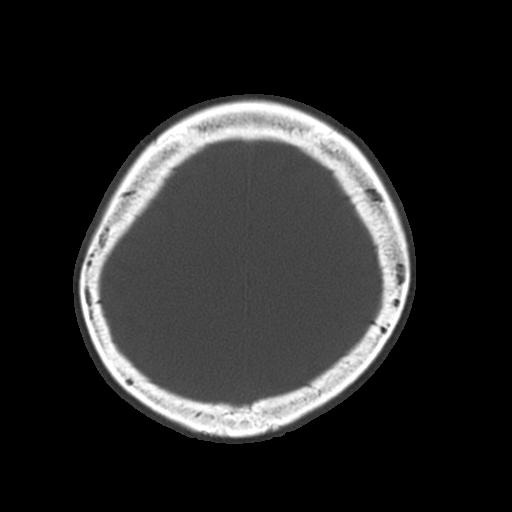
[im 70/85  bone]
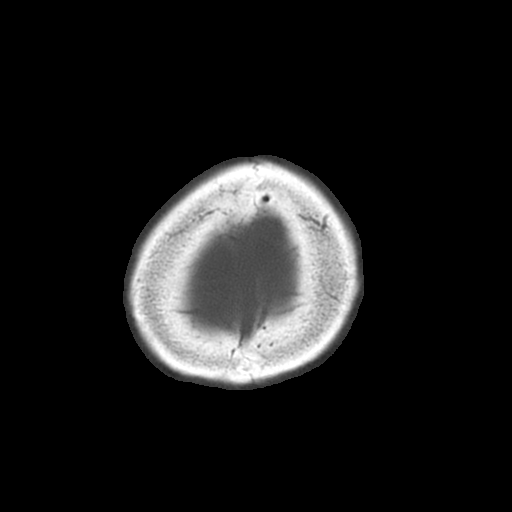
[im 80/85  bone]
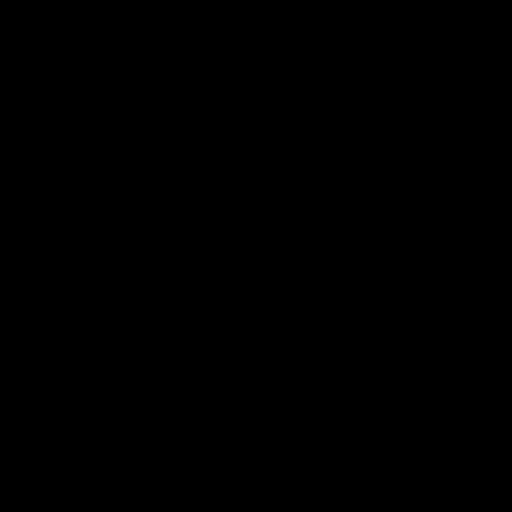

[Series 3: head wo · axial · 0.40mm/px · z∈[-155,-65]mm · 4 of 31 slices shown, 5 images]
[im 7/31  brain]
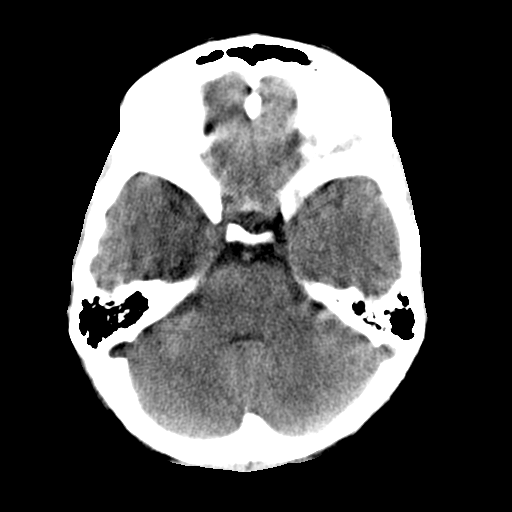
[im 7/31  bone]
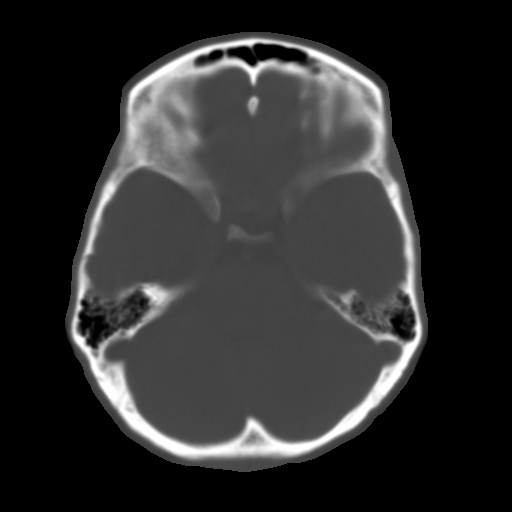
[im 13/31  brain]
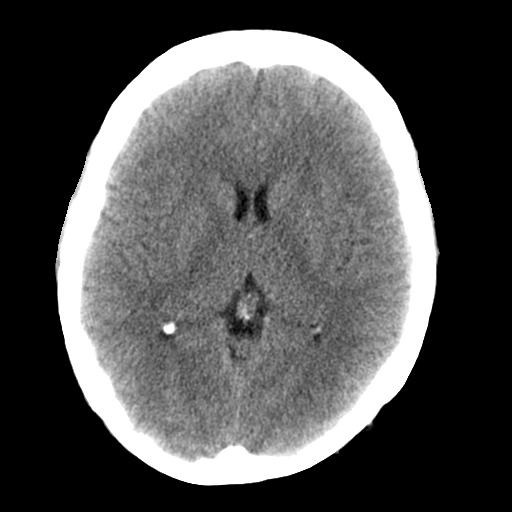
[im 19/31  brain]
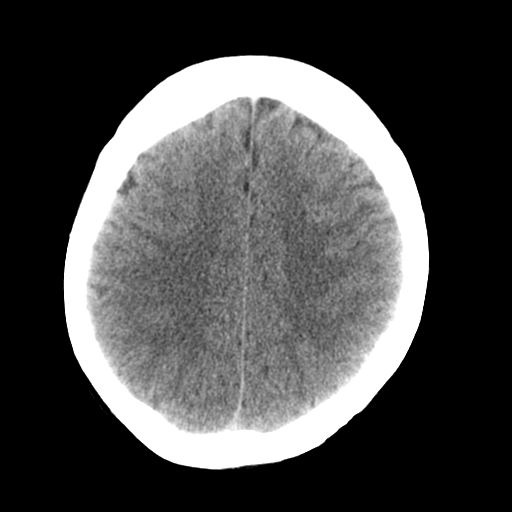
[im 25/31  brain]
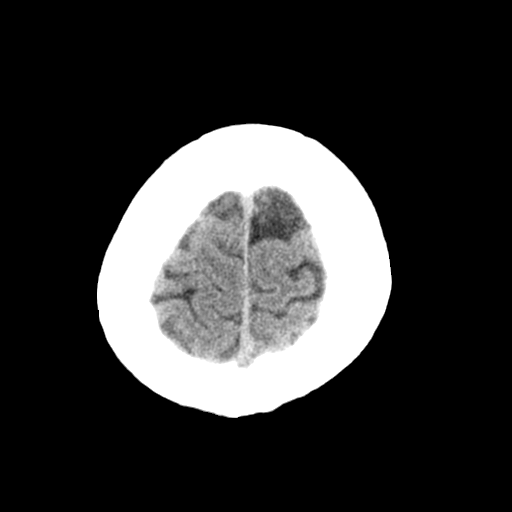

[Series 4: head wo recons · axial · 0.40mm/px · z∈[-131,-42]mm · 4 of 31 slices shown]
[im 7/31  brain]
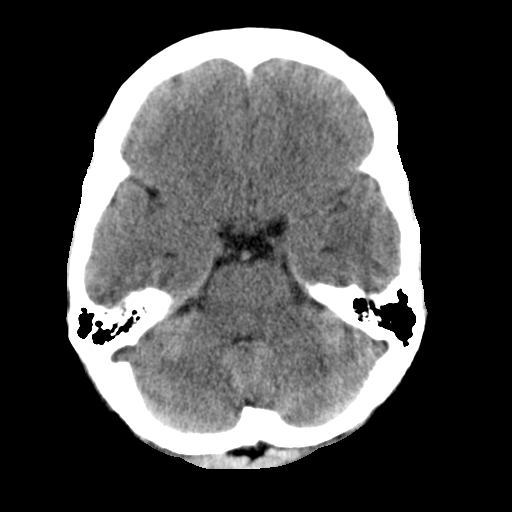
[im 13/31  brain]
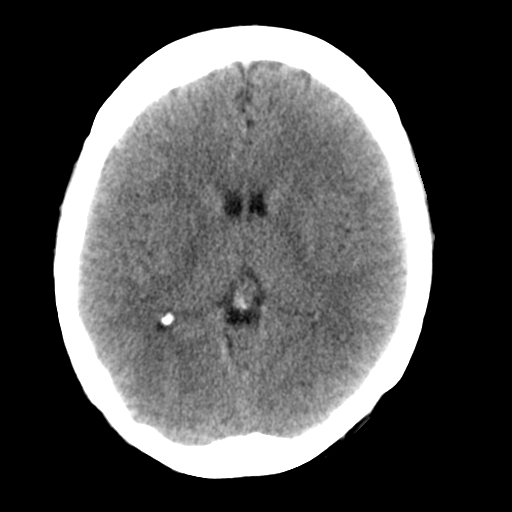
[im 19/31  brain]
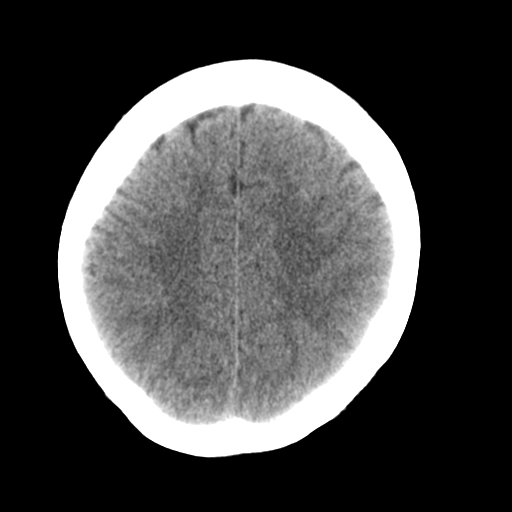
[im 25/31  brain]
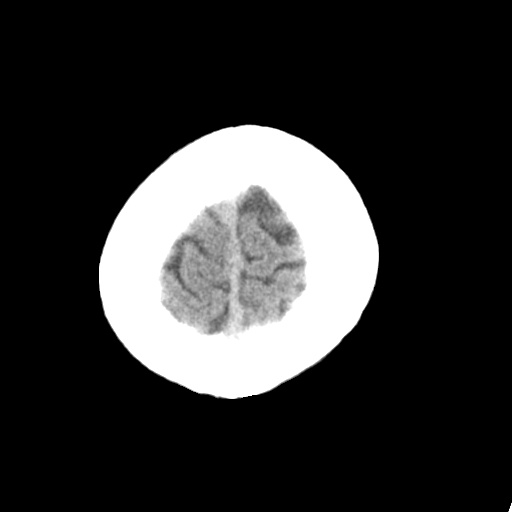

[16 of 30 positions shown; findings below may reference images not displayed]

FINDINGS: Ventricle size is normal. Negative for acute or chronic infarction.
Negative for hemorrhage or fluid collection. Negative for mass or
edema. No shift of the midline structures.

Calvarium is intact.
IMPRESSION: Normal

## 2020-02-24 ENCOUNTER — Ambulatory Visit: Attending: Family Medicine | Primary: Family Medicine

## 2020-02-24 ENCOUNTER — Ambulatory Visit: Admit: 2020-02-24 | Discharge: 2020-02-24 | Payer: MEDICAID | Attending: Family Medicine | Primary: Family Medicine

## 2020-02-24 DIAGNOSIS — Z0000 Encounter for general adult medical examination without abnormal findings: Secondary | ICD-10-CM

## 2020-02-24 MED ORDER — LEVOCETIRIZINE 5 MG TAB
5 mg | ORAL_TABLET | Freq: Every day | ORAL | 3 refills | Status: DC
Start: 2020-02-24 — End: 2020-08-31

## 2020-02-24 MED ORDER — SUMATRIPTAN 50 MG TAB
50 mg | ORAL_TABLET | Freq: Once | ORAL | 0 refills | Status: AC | PRN
Start: 2020-02-24 — End: 2020-02-24

## 2020-02-24 NOTE — Progress Notes (Signed)
No chief complaint on file.    she is a 54 y.o. year old female who presents for CPE  Complete Physical Exam Questions:    LMP -  n/a  Last Pap (q 3 years, or q5 with HPV) - 2 year  Last Mammogram (50-74 biennially)- n/a  Hx of abnl Pap - No    1.  Do you follow a low fat diet?  yes  2.  Are you up to date on your Tdap (<10 years)?  Yes  3.  Have you ever had a Pneumovax vaccine (>65)?  No   PCV13 No   PPSV23 No  4.  Have you had Zoster vaccine (>60)? No  5.  Have you had the HPV - Gardasil (13- 26)? No  6.  Do you follow an exercise program?  yes  7.  Do you smoke?  no  8.  Do you consider yourself overweight?  yes  9.  Is there a family history of CAD< age 26? No  10.  Is there a family history of Cancer?  no  11.  Do you know your Cancer risks?  Yes  12.  Have you had a colonoscopy?  no  13. Have you been tested for HIV or other STI's? No HIV testing today(18-65 y/o)?No  14.  Have had a bone density scan or DEXA done(>65)?No  15.  Have you had an EKG performed in the last five years (>50)?  Yes    Other complaints:    Reviewed and agree with Nurse Note and duplicated in this note.  Reviewed PmHx, RxHx, FmHx, SocHx, AllgHx and updated and dated in the chart.    History reviewed. No pertinent family history.    Past Medical History:   Diagnosis Date   ??? Alcohol abuse    ??? Depression    ??? Headache    ??? MI (myocardial infarction) (HCC)     x 4.      Social History     Socioeconomic History   ??? Marital status: LEGALLY SEPARATED   Tobacco Use   ??? Smoking status: Former Smoker     Packs/day: 0.25     Years: 12.00     Pack years: 3.00     Types: Cigarettes   ??? Smokeless tobacco: Never Used   Substance and Sexual Activity   ??? Alcohol use: Not Currently     Alcohol/week: 4.0 standard drinks     Types: 4 Cans of beer per week   ??? Drug use: No        Review of Systems - negative except as listed above      Objective:     Vitals:    02/24/20 1312   BP: 108/76   Pulse: (!) 101   Resp: 16   Temp: 98.3 ??F (36.8 ??C)   SpO2: 96%    Weight: 168 lb (76.2 kg)   Height: 5\' 10"  (1.778 m)       Physical Examination: General appearance - alert, well appearing, and in no distress  Eyes - pupils equal and reactive, extraocular eye movements intact  Ears - bilateral TM's and external ear canals normal  Nose - normal and patent, no erythema, discharge or polyps  Mouth - mucous membranes moist, pharynx normal without lesions  Neck - supple, no significant adenopathy  Chest - clear to auscultation, no wheezes, rales or rhonchi, symmetric air entry  Heart - normal rate, regular rhythm, normal S1, S2, no murmurs, rubs, clicks or  gallops  Abdomen - soft, nontender, nondistended, no masses or organomegaly  Musculoskeletal - no joint tenderness, deformity or swelling  Extremities - peripheral pulses normal, no pedal edema, no clubbing or cyanosis  Skin - normal coloration and turgor, no rashes, no suspicious skin lesions noted      Assessment/ Plan:   Diagnoses and all orders for this visit:    1. Well woman exam (no gynecological exam)  -     CBC W/O DIFF; Future  -     LIPID PANEL; Future  -     METABOLIC PANEL, COMPREHENSIVE; Future    2. Encounter for screening mammogram for malignant neoplasm of breast  -     MAM MAMMO BI SCREENING INCL CAD; Future    3. Encounter for colorectal cancer screening  -     REFERRAL TO GASTROENTEROLOGY    4. Depression, unspecified depression type    5. Migraine without aura and without status migrainosus, not intractable    6. Environmental allergies    7. Cervical cancer screening  -     REFERRAL TO OBSTETRICS AND GYNECOLOGY    Other orders  -     levocetirizine (XYZAL) 5 mg tablet; Take 1 Tablet by mouth daily.  -     SUMAtriptan (IMITREX) 50 mg tablet; Take 1 Tablet by mouth once as needed for Migraine for up to 1 dose.           Labs to be drawn: CBC, CMP, Lipid            I have discussed the diagnosis with the patient and the intended plan as seen in the above orders.  The patient has received an after-visit summary  and questions were answered concerning future plans.   Medication Side Effects and Warnings were discussed with patient,  Patient Labs were reviewed and or requested, and  Patient Past Records were reviewed and or requested  yes       Chief Complaint   Patient presents with   ??? Complete Physical     she is a 54 y.o. year old female who presents for evaluation of her mental allergies.  Patient is taking Claritin for years and believes that she does not respond to this medication anymore.  She would like to try a new medication.  She does take Flonase over-the-counter.  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  Denies any fever chills sweats  Patient is also by psychiatry for depression.  She denies any suicidal ideations and states that her depression is well under control with Wellbutrin and lamotrigine.  Patient also has a history of migraines can be once a month probably up to 3 times a month.  Patient has taken Imitrex in the past with good results and would like a refill on this medication.  She denies any head trauma      Reviewed and agree with Nurse Note and duplicated in this note.  Reviewed PmHx, RxHx, FmHx, SocHx, AllgHx and updated and dated in the chart.    History reviewed. No pertinent family history.    Past Medical History:   Diagnosis Date   ??? Alcohol abuse    ??? Depression    ??? Headache    ??? MI (myocardial infarction) (HCC)     x 4.      Social History     Socioeconomic History   ??? Marital status: LEGALLY SEPARATED   Tobacco Use   ???  Smoking status: Former Smoker     Packs/day: 0.25     Years: 12.00     Pack years: 3.00     Types: Cigarettes   ??? Smokeless tobacco: Never Used   Substance and Sexual Activity   ??? Alcohol use: Not Currently     Alcohol/week: 4.0 standard drinks     Types: 4 Cans of beer per week   ??? Drug use: No        Review of Systems - negative except as listed above      Objective:     Vitals:    02/24/20 1312   BP:  108/76   Pulse: (!) 101   Resp: 16   Temp: 98.3 ??F (36.8 ??C)   SpO2: 96%   Weight: 168 lb (76.2 kg)   Height: 5\' 10"  (1.778 m)       Physical Examination: General appearance - alert, well appearing, and in no distress  Eyes - pupils equal and reactive, extraocular eye movements intact  Ears - bilateral TM's and external ear canals normal  Nose - normal and patent, no erythema, discharge or polyps  Mouth - mucous membranes moist, pharynx normal without lesions  Neck - supple, no significant adenopathy  Chest - clear to auscultation, no wheezes, rales or rhonchi, symmetric air entry  Heart - normal rate, regular rhythm, normal S1, S2, no murmurs, rubs, clicks or gallops  Abdomen - soft, nontender, nondistended, no masses or organomegaly  Neurological - alert, oriented, normal speech, no focal findings or movement disorder noted  Musculoskeletal - no joint tenderness, deformity or swelling  Extremities - peripheral pulses normal, no pedal edema, no clubbing or cyanosis  Skin - normal coloration and turgor, no rashes, no suspicious skin lesions noted     Assessment/ Plan:   Diagnoses and all orders for this visit:    1. Well woman exam (no gynecological exam)  -     CBC W/O DIFF; Future  -     LIPID PANEL; Future  -     METABOLIC PANEL, COMPREHENSIVE; Future    2. Encounter for screening mammogram for malignant neoplasm of breast  -     MAM MAMMO BI SCREENING INCL CAD; Future    3. Encounter for colorectal cancer screening  -     REFERRAL TO GASTROENTEROLOGY    4. Depression, unspecified depression type    5. Migraine without aura and without status migrainosus, not intractable  -     SUMAtriptan (IMITREX) 50 mg tablet; Take 1 Tablet by mouth once as needed for Migraine for up to 1 dose.    6. Environmental allergies  -     levocetirizine (XYZAL) 5 mg tablet; Take 1 Tablet by mouth daily.    7. Cervical cancer screening  -     REFERRAL TO OBSTETRICS AND GYNECOLOGY                I have discussed the diagnosis with the  patient and the intended plan as seen in the above orders.  The patient has received an after-visit summary and questions were answered concerning future plans.     Medication Side Effects and Warnings were discussed with patient,  Patient Labs were reviewed and or requested, and  Patient Past Records were reviewed and or requested  yes       Pt agrees to call or return to clinic and/or go to closest ER with any worsening of symptoms.  This may include, but not  limited to increased fever (>100.4) with NSAIDS or Tylenol, increased edema, confusion, rash, worsening of presenting symptoms.    Please note that this dictation was completed with Dragon, the computer voice recognition software.  Quite often unanticipated grammatical, syntax, homophones, and other interpretive errors are inadvertently transcribed by the computer software.  Please disregard these errors.  Please excuse any errors that have escaped final proofreading.  Thank you.

## 2020-02-24 NOTE — Addendum Note (Signed)
Addendum  Note by Rhys Martini at 02/24/20 1300                Author: Rhys Martini  Service: --  Author Type: Technician       Filed: 02/24/20 1431  Encounter Date: 02/24/2020  Status: Signed          Editor: Rhys Martini (Technician)          Addended by: Rhys Martini on: 02/24/2020 02:31 PM    Modules accepted: Orders

## 2020-02-24 NOTE — Progress Notes (Signed)
Your kidney function is slightly elevated, please avoid anti-inflammatory such as ibuprofen or Aleve and continue to stay well-hydrated.  Please repeat this in 1 month

## 2020-02-24 NOTE — Addendum Note (Signed)
Addendum  Note by Kennyth Arnold at 02/24/20 1300                Author: Kennyth Arnold  Service: --  Author Type: Medical Assistant       Filed: 03/21/20 1045  Encounter Date: 02/24/2020  Status: Signed          Editor: Kennyth Arnold (Medical Assistant)          Addended by: Kennyth Arnold on: 03/21/2020 10:45 AM    Modules accepted: Orders, SmartSet

## 2020-02-25 ENCOUNTER — Encounter

## 2020-02-25 LAB — METABOLIC PANEL, COMPREHENSIVE
A-G Ratio: 1.6 (ref 1.2–2.2)
ALT (SGPT): 35 IU/L — ABNORMAL HIGH (ref 0–32)
AST (SGOT): 31 IU/L (ref 0–40)
Albumin: 4.1 g/dL (ref 3.8–4.9)
Alk. phosphatase: 53 IU/L (ref 44–121)
BUN/Creatinine ratio: 12 (ref 9–23)
BUN: 13 mg/dL (ref 6–24)
Bilirubin, total: 0.2 mg/dL (ref 0.0–1.2)
CO2: 24 mmol/L (ref 20–29)
Calcium: 9.1 mg/dL (ref 8.7–10.2)
Chloride: 105 mmol/L (ref 96–106)
Creatinine: 1.06 mg/dL — ABNORMAL HIGH (ref 0.57–1.00)
GFR est AA: 68 mL/min/{1.73_m2} (ref >59)
GFR est non-AA: 59 mL/min/{1.73_m2} — ABNORMAL LOW (ref >59)
GLOBULIN, TOTAL: 2.5 g/dL (ref 1.5–4.5)
Glucose: 94 mg/dL (ref 65–99)
Potassium: 4.5 mmol/L (ref 3.5–5.2)
Protein, total: 6.6 g/dL (ref 6.0–8.5)
Sodium: 141 mmol/L (ref 134–144)

## 2020-02-25 LAB — LIPID PANEL
Cholesterol, Total: 138 mg/dL (ref 100–199)
Cholesterol, total: 138 mg/dL (ref 100–199)
HDL Cholesterol: 59 mg/dL (ref >39)
HDL: 59 mg/dL (ref >39)
LDL Calculated: 65 mg/dL (ref 0–99)
LDL, calculated: 65 mg/dL (ref 0–99)
Triglyceride: 68 mg/dL (ref 0–149)
Triglycerides: 68 mg/dL (ref 0–149)
VLDL, calculated: 14 mg/dL (ref 5–40)
VLDL: 14 mg/dL (ref 5–40)

## 2020-02-25 LAB — CBC W/O DIFF
HCT: 40 % (ref 34.0–46.6)
HGB: 13.6 g/dL (ref 11.1–15.9)
MCH: 30.6 pg (ref 26.6–33.0)
MCHC: 34 g/dL (ref 31.5–35.7)
MCV: 90 fL (ref 79–97)
PLATELET: 283 10*3/uL (ref 150–450)
RBC: 4.45 x10E6/uL (ref 3.77–5.28)
RDW: 12.3 % (ref 11.7–15.4)
WBC: 10.5 10*3/uL (ref 3.4–10.8)

## 2020-02-25 LAB — CBC
Hematocrit: 40 % (ref 34.0–46.6)
Hemoglobin: 13.6 g/dL (ref 11.1–15.9)
MCH: 30.6 pg (ref 26.6–33.0)
MCHC: 34 g/dL (ref 31.5–35.7)
MCV: 90 fL (ref 79–97)
Platelets: 283 10*3/uL (ref 150–450)
RBC: 4.45 x10E6/uL (ref 3.77–5.28)
RDW: 12.3 % (ref 11.7–15.4)
WBC: 10.5 10*3/uL (ref 3.4–10.8)

## 2020-02-25 LAB — COMPREHENSIVE METABOLIC PANEL
ALT: 35 IU/L — ABNORMAL HIGH (ref 0–32)
AST: 31 IU/L (ref 0–40)
Albumin/Globulin Ratio: 1.6 NA (ref 1.2–2.2)
Albumin: 4.1 g/dL (ref 3.8–4.9)
Alkaline Phosphatase: 53 IU/L (ref 44–121)
BUN: 13 mg/dL (ref 6–24)
Bun/Cre Ratio: 12 NA (ref 9–23)
CO2: 24 mmol/L (ref 20–29)
Calcium: 9.1 mg/dL (ref 8.7–10.2)
Chloride: 105 mmol/L (ref 96–106)
Creatinine: 1.06 mg/dL — ABNORMAL HIGH (ref 0.57–1.00)
EGFR IF NonAfrican American: 59 mL/min/{1.73_m2} — ABNORMAL LOW (ref >59)
GFR African American: 68 mL/min/{1.73_m2} (ref >59)
Globulin, Total: 2.5 g/dL (ref 1.5–4.5)
Glucose: 94 mg/dL (ref 65–99)
Potassium: 4.5 mmol/L (ref 3.5–5.2)
Sodium: 141 mmol/L (ref 134–144)
Total Bilirubin: 0.2 mg/dL (ref 0.0–1.2)
Total Protein: 6.6 g/dL (ref 6.0–8.5)

## 2020-02-25 NOTE — Progress Notes (Signed)
cm

## 2020-04-03 ENCOUNTER — Inpatient Hospital Stay: Payer: MEDICAID | Attending: Family Medicine | Primary: Family Medicine

## 2020-07-01 NOTE — Telephone Encounter (Signed)
 Received call from [place] at [facility] with Red Flag Complaint.    Subjective: Caller states  I work, Im a usually a pretty high energy person, I do a lot but for the last couple of weeks I sleep a lot. I can lay down and go to sleep and I have plans and just wont go. The house could be on fire and I could sleep. Every night I wake and my hands are tingling. This morning my hands hurt to try to brush my teeth. My 57 yo daughter passed away, my dad, and my brother is next all from heart disease. I don't want to be next. I have had a lot of heart burn. I have hx of heart burn but not to the extent Ive had it lately. Before it was like normal heart burn but now it makes my throat hurt. SOB with exertion and that is new. The fatigue is the worst. I stay hydrated cause I know that's important and I eat when I am awake.       Pt works a second job and has called off for the past week due to being too tired.     Current Symptoms: extreme fatigue, BL hand swelling/tingling/pain- denies tingling at this time, SOB but denies currently      Onset: a few weeks ago; worsening    Associated Symptoms: reduced activity, increased sleepiness    Pain Severity: 9/10; aching, burning and tingling; intermittent    Temperature: denies      What has been tried:  Motrin, Nuprin, Pepto, Tums       Recommended disposition: Go to [facility]    Care advice provided, patient verbalizes understanding; denies any other questions or concerns; instructed to call back for any new or worsening symptoms.    Patient/Caller agrees with recommended disposition; Clinical research associate provided warm transfer to [employer]  at [facility] for appointment scheduling    Attention Provider:  Thank you for allowing me to participate in the care of your patient.  The patient was connected to triage in response to information provided to the ECC.  Please do not respond through this encounter as the response is not directed to a shared pool.      Reason for Disposition  .  MODERATE weakness (i.e., interferes with work, school, normal activities) and cause unknown (Exceptions: weakness with acute minor illness, or weakness from poor fluid intake)    Protocols used: WEAKNESS (GENERALIZED) AND FATIGUE-ADULT-OH

## 2020-07-06 ENCOUNTER — Inpatient Hospital Stay: Payer: BLUE CROSS/BLUE SHIELD

## 2020-07-06 MED ORDER — LIDOCAINE (PF) 20 MG/ML (2 %) IJ SOLN
20 mg/mL (2 %) | INTRAMUSCULAR | Status: DC | PRN
Start: 2020-07-06 — End: 2020-07-06
  Administered 2020-07-06: 20:00:00 via INTRAVENOUS

## 2020-07-06 MED ORDER — SIMETHICONE 40 MG/0.6 ML ORAL DROPS, SUSP
40 mg/0.6 mL | ORAL | Status: DC | PRN
Start: 2020-07-06 — End: 2020-07-07
  Administered 2020-07-06: 20:00:00 via ORAL

## 2020-07-06 MED ORDER — FLUMAZENIL 0.1 MG/ML IV SOLN
0.1 mg/mL | INTRAVENOUS | Status: DC | PRN
Start: 2020-07-06 — End: 2020-07-06

## 2020-07-06 MED ORDER — ATROPINE 0.1 MG/ML SYRINGE
0.1 mg/mL | Freq: Once | INTRAMUSCULAR | Status: DC | PRN
Start: 2020-07-06 — End: 2020-07-07

## 2020-07-06 MED ORDER — PROPOFOL 10 MG/ML IV EMUL
10 mg/mL | INTRAVENOUS | Status: DC | PRN
Start: 2020-07-06 — End: 2020-07-06
  Administered 2020-07-06 (×11): via INTRAVENOUS

## 2020-07-06 MED ORDER — SODIUM CHLORIDE 0.9 % IV
INTRAVENOUS | Status: DC
Start: 2020-07-06 — End: 2020-07-06

## 2020-07-06 MED ORDER — SODIUM CHLORIDE 0.9 % IJ SYRG
INTRAMUSCULAR | Status: DC | PRN
Start: 2020-07-06 — End: 2020-07-07

## 2020-07-06 MED ORDER — SODIUM CHLORIDE 0.9 % IJ SYRG
Freq: Three times a day (TID) | INTRAMUSCULAR | Status: DC
Start: 2020-07-06 — End: 2020-07-07

## 2020-07-06 MED ORDER — EPINEPHRINE 0.1 MG/ML SYRINGE
0.1 mg/mL | Freq: Once | INTRAMUSCULAR | Status: DC | PRN
Start: 2020-07-06 — End: 2020-07-07

## 2020-07-06 MED ORDER — NALOXONE 0.4 MG/ML INJECTION
0.4 mg/mL | INTRAMUSCULAR | Status: DC | PRN
Start: 2020-07-06 — End: 2020-07-06

## 2020-07-06 MED ORDER — FENTANYL CITRATE (PF) 50 MCG/ML IJ SOLN
50 mcg/mL | INTRAMUSCULAR | Status: DC | PRN
Start: 2020-07-06 — End: 2020-07-07

## 2020-07-06 MED ORDER — MIDAZOLAM 1 MG/ML IJ SOLN
1 mg/mL | INTRAMUSCULAR | Status: DC | PRN
Start: 2020-07-06 — End: 2020-07-06

## 2020-07-06 MED ORDER — SODIUM CHLORIDE 0.9 % IV
INTRAVENOUS | Status: DC | PRN
Start: 2020-07-06 — End: 2020-07-06
  Administered 2020-07-06: 19:00:00 via INTRAVENOUS

## 2020-07-06 MED FILL — SODIUM CHLORIDE 0.9 % IV: INTRAVENOUS | Qty: 1000

## 2020-07-06 MED FILL — NORMAL SALINE FLUSH 0.9 % INJECTION SYRINGE: INTRAMUSCULAR | Qty: 40

## 2020-07-06 NOTE — Anesthesia Pre-Procedure Evaluation (Signed)
Relevant Problems   No relevant active problems       Anesthetic History   No history of anesthetic complications            Review of Systems / Medical History  Patient summary reviewed, nursing notes reviewed and pertinent labs reviewed    Pulmonary  Within defined limits                 Neuro/Psych   Within defined limits           Cardiovascular              CAD         GI/Hepatic/Renal           Liver disease     Endo/Other  Within defined limits           Other Findings                   Anesthetic Plan    ASA: 2  Anesthesia type: MAC            Anesthetic plan and risks discussed with: Patient

## 2020-07-06 NOTE — Anesthesia Post-Procedure Evaluation (Signed)
Post-Anesthesia Evaluation and Assessment    Patient: Brittney Lawson MRN: [id number]  SSN: [id number]    Date of Birth: 09/26/1965  Age: 55 y.o.  Sex: female      I have evaluated the patient and they are stable and ready for discharge from the PACU.     Cardiovascular Function/Vital Signs  Visit Vitals  BP 128/80   Pulse 81   Temp 36.7 ??C (98 ??F)   Resp 17   Ht _0  (1.778 m)   Wt 74.8 kg (165 lb)   SpO2 97%   Breastfeeding No   BMI 23.68 kg/m??       Patient is status post MAC anesthesia for Procedure(s):  COLONOSCOPY  ENDOSCOPIC POLYPECTOMY.    Nausea/Vomiting: None    Postoperative hydration reviewed and adequate.    Pain:  Pain Scale 1: Numeric (0 - 10) (07/06/20 1635)  Pain Intensity 1: 0 (07/06/20 1635)   Managed    Neurological Status:       At baseline    Mental Status, Level of Consciousness: Alert and  oriented to person, place, and time    Pulmonary Status:   O2 Device: None (Room air) (07/06/20 1635)   Adequate oxygenation and airway patent    Complications related to anesthesia: None    Post-anesthesia assessment completed. No concerns    Signed By: Beryle Quant, MD     Jul 06, 2020              Procedure(s):  COLONOSCOPY  ENDOSCOPIC POLYPECTOMY.    MAC    <BSHSIANPOST>    INITIAL Post-op Vital signs:   Vitals Value Taken Time   BP 128/80 07/06/20 1635   Temp 36.7 ??C (98 ??F) 07/06/20 1611   Pulse 80 07/06/20 1638   Resp 15 07/06/20 1638   SpO2 100 % 07/06/20 1638   Vitals shown include unvalidated device data.

## 2020-07-06 NOTE — H&P (Signed)
H&P by Orrin Brigham, MD at 07/06/20 1542                Author: Orrin Brigham, MD  Service: Gastroenterology  Author Type: Physician       Filed: 07/06/20 1543  Date of Service: 07/06/20 1542  Status: Signed          Editor: Orrin Brigham, MD (Physician)               [facility]   [address] [place]   [place], [place]   [phone]                                        History and Physical       NAME: Brittney Lawson    DOB:  Oct 30, 1965    MRN:  [id number]       HPI:  The patient was seen and examined.        Past Surgical History:         Procedure  Laterality  Date          ?  HX KNEE ARTHROSCOPY  Left            Past Medical History:        Diagnosis  Date         ?  Alcohol abuse       ?  Depression       ?  Headache       ?  MI (myocardial infarction) ([place])            x 4; no interventions          Social History          Tobacco Use         ?  Smoking status:  Former Smoker              Packs/day:  0.25         Years:  12.00         Pack years:  3.00         Types:  Cigarettes         ?  Smokeless tobacco:  Never Used       Substance Use Topics         ?  Alcohol use:  Not Currently              Alcohol/week:  4.0 standard drinks         Types:  4 Cans of beer per week             Comment: sober 2021         ?  Drug use:  Not Currently              Types:  Cocaine        No Known Allergies   History reviewed. No pertinent family history.     Current Facility-Administered Medications          Medication  Dose  Route  Frequency           ?  0.9% sodium chloride infusion   50 mL/hr  IntraVENous  CONTINUOUS     ?  sodium chloride (NS) flush 5-40 mL   5-40 mL  IntraVENous  Q8H     ?  sodium chloride (NS) flush 5-40 mL   5-40 mL  IntraVENous  PRN     ?  midazolam (VERSED) injection 0.25-10 mg   0.25-10 mg  IntraVENous  Multiple     ?  fentaNYL citrate (PF) injection 100 mcg   100 mcg  IntraVENous  MULTIPLE DOSE GIVEN     ?  naloxone (NARCAN) injection 0.4 mg    0.4 mg  IntraVENous  Multiple     ?  flumazeniL (ROMAZICON) 0.1 mg/mL injection 0.2 mg   0.2 mg  IntraVENous  Multiple     ?  simethicone (MYLICON) 48NI/6.2VO oral drops 80 mg   1.2 mL  Oral  Multiple     ?  atropine injection 0.5 mg   0.5 mg  IntraVENous  ONCE PRN           ?  EPINEPHrine (ADRENALIN) 0.1 mg/mL syringe 1 mg   1 mg  Endoscopically  ONCE PRN          Facility-Administered Medications Ordered in Other Encounters          Medication  Dose  Route  Frequency           ?  0.9% sodium chloride infusion     IntraVENous  CONTINUOUS              PHYSICAL EXAM:   General: WD, WN. Alert, cooperative, no acute distress     HEENT: NC, Atraumatic.  PERRLA, EOMI. Anicteric sclerae.   Lungs:  CTA Bilaterally. No Wheezing/Rhonchi/Rales.   Heart:  Regular  rhythm,  No murmur, No Rubs, No Gallops   Abdomen: Soft, Non distended, Non tender.  +Bowel sounds, no HSM   Extremities: No c/c/e   Neurologic:  CN 2-12 gi, Alert and oriented X 3.  No acute neurological distress    Psych:   Good insight. Not anxious nor agitated.      The heart, lungs and mental status were satisfactory for the administration of MAC sedation and for the procedure.        Mallampati score: 2       The patient was counseled at length about the risks of contracting Covid-19 in the peri-operative and post-operative states including the recovery window of their procedure.  The patient was made aware that contracting Covid-19 after a surgical procedure  may worsen their prognosis for recovering from the virus and lend to a higher morbidity and or mortality risk.  The patient was given the options of postponing their procedure. All of the risks, benefits, and alternatives were discussed. The patient does  wish to proceed with the procedure.          Assessment:     ??  screening        Plan:     ??  Endoscopic procedure :colonoscopy   ??  MAC sedation

## 2020-07-06 NOTE — Procedures (Signed)
Procedures  by Orrin Brigham, MD at 07/06/20 1609                Author: Orrin Brigham, MD  Service: Gastroenterology  Author Type: Physician       Filed: 07/06/20 1613  Date of Service: 07/06/20 1609  Status: Signed          Editor: Orrin Brigham, MD (Physician)               [facility]   [address] [place]   [place], [place]   [phone]                                      Colonoscopy Procedure Note         Indications:  Screening, average risk        Operator:  Orrin Brigham, MD      Staff: Endoscopy Technician-1: Bernell List   Endoscopy RN-1: Franki Cabot, RN      Referring Provider: Elgie Collard, MD      Sedation:  MAC anesthesia      Procedure Details:  After informed consent was obtained with all risks and benefits of procedure explained and preoperative exam completed,  the patient was taken to the endoscopy suite and placed in the left lateral decubitus position.  Upon sequential sedation as per above, a digital rectal exam was performed per below.  The Olympus videocolonoscope was inserted in the rectum and carefully  advanced to the terminal ileum.  The quality of preparation was good.  Boston Bowel Prep Score :3/3/3.The colonoscope was slowly withdrawn with careful evaluation between folds. Retroflexion in the rectum was performed.       Findings:    Rectum: normal   Sigmoid: one 5 mm sessile polyp, removed with cold snare, one 9 mm semi-pedunculated polyp, removed with hot snare, moderate diverticulosis    Descending Colon:  mild diverticulosis    Transverse Colon: normal   Ascending Colon: normal   Cecum: normal   Terminal Ileum: normal      Interventions:  Polypectomy times 2      Specimen Removed:             ID  Type  Source  Tests  Collected by  Time  Destination             1 : Sigmoid Colon Polyps  Preservative  Sigmoid    Orrin Brigham, MD  07/06/2020 1601  Pathology           Complications: None.       EBL:  Minimal        Impression:     See Postoperative diagnosis above      Recommendations:    - Await pathology. You should receive a letter within 2 weeks.    - Resume normal medications.   - Timing of next colonoscopy pending pathology, likely 3 or 5 years.       Discharge Disposition:  Home in the company of a driver when able to ambulate.      Orrin Brigham, MD   07/06/2020  4:09 PM

## 2020-07-06 NOTE — Progress Notes (Signed)
Initial RN admission and assessment performed and documented in Endoscopy navigator.     Patient evaluated by anesthesia in pre-procedure holding.     All procedural vital signs, airway assessment, and level of consciousness information monitored and recorded by anesthesia staff on the anesthesia record.     Report received from CRNA post procedure.  Patient transported to recovery area by RN.    Endoscope was pre-cleaned at bedside immediately following procedure by Justin Cale.

## 2020-08-24 ENCOUNTER — Encounter: Attending: Family Medicine | Primary: Family Medicine

## 2020-08-31 ENCOUNTER — Ambulatory Visit: Attending: Family Medicine | Primary: Family Medicine

## 2020-08-31 ENCOUNTER — Ambulatory Visit: Admit: 2020-08-31 | Discharge: 2020-08-31 | Payer: MEDICAID | Attending: Family Medicine | Primary: Family Medicine

## 2020-08-31 DIAGNOSIS — K219 Gastro-esophageal reflux disease without esophagitis: Secondary | ICD-10-CM

## 2020-08-31 MED ORDER — SUMATRIPTAN 20 MG/ACTUATION NASAL SPRAY
20 mg/actuation | NASAL | 2 refills | Status: AC | PRN
Start: 2020-08-31 — End: ?

## 2020-08-31 MED ORDER — FAMOTIDINE 40 MG TAB
40 mg | ORAL_TABLET | Freq: Every day | ORAL | 2 refills | Status: AC
Start: 2020-08-31 — End: ?

## 2020-08-31 NOTE — Telephone Encounter (Signed)
Patient called back to let you know that the Imitrex nasal spray not covered

## 2020-08-31 NOTE — Progress Notes (Signed)
Chief Complaint   Patient presents with   ??? Headache     Patient is here for severe headaches    ??? Heartburn     Patient is here for heart burn      she is a 55 y.o. year old female who presents for evaluation of Headache     Pt has had headaches for  day(s). Description of pain: throbbing pain, dull pain, sharp pain. Duration of individual headaches: 4 day(s), frequency daily. Associated symptoms: nausea. Pain relief: unable to obtain relief with OTC meds. Precipitating factors: patient is aware of none. She denies a history of recent head injury.   Prior neurological history: negative for no neurological problems.      she is a 55 y.o. year old female who presents for evaluation of dyspepsia. Symptoms have been present for approximately 2 months.  Symptoms include dysphagia. The patient denies abdominal bloating, belching and fullness after meals. Symptoms appear to be worsened by alcohol and caffeine. Risk factors present for GERD include alcohol use. Studies performed so far include colonoscopy, result: negative. Treatments tried so far include lifestyle change including caffeine reduction. Results of treatment: some improvement in symptom severity. Currently, the symptoms are moderate and occur approximately 1 times per day.    Reviewed and agree with Nurse Note and duplicated in this note.  Reviewed PmHx, RxHx, FmHx, SocHx, AllgHx and updated and dated in the chart.    No family history on file.    Past Medical History:   Diagnosis Date   ??? Alcohol abuse    ??? Depression    ??? Headache    ??? MI (myocardial infarction) (HCC)     x 4; no interventions      Social History     Socioeconomic History   ??? Marital status: LEGALLY SEPARATED   Tobacco Use   ??? Smoking status: Former Smoker     Packs/day: 0.25     Years: 12.00     Pack years: 3.00     Types: Cigarettes   ??? Smokeless tobacco: Never Used   Substance and Sexual Activity   ??? Alcohol use: Not Currently     Alcohol/week: 4.0 standard drinks     Types: 4 Cans of  beer per week     Comment: sober 2021   ??? Drug use: Not Currently     Types: Cocaine        Review of Systems - negative except as listed above      Objective:     Vitals:    08/31/20 1148   BP: 109/78   Pulse: 82   Resp: 16   Temp: 98 ??F (36.7 ??C)   SpO2: 99%   Weight: 167 lb 1.6 oz (75.8 kg)   Height: 5\' 10"  (1.778 m)       Physical Examination: General appearance - alert, well appearing, and in no distress  Eyes - pupils equal and reactive, extraocular eye movements intact  Ears - bilateral TM's and external ear canals normal  Nose - normal and patent, no erythema, discharge or polyps  Mouth - mucous membranes moist, pharynx normal without lesions  Neck - supple, no significant adenopathy  Chest - clear to auscultation, no wheezes, rales or rhonchi, symmetric air entry  Heart - normal rate, regular rhythm, normal S1, S2, no murmurs, rubs, clicks or gallops  Abdomen - soft, nontender, nondistended, no masses or organomegaly  Extremities - peripheral pulses normal, no pedal edema, no clubbing or cyanosis  Skin - normal coloration and turgor, no rashes, no suspicious skin lesions noted     Assessment/ Plan:   Diagnoses and all orders for this visit:    1. Gastroesophageal reflux disease, unspecified whether esophagitis present  -     H PYLORI AB, IGG, QT; Future  -     famotidine (PEPCID) 40 mg tablet; Take 1 Tablet by mouth daily.    2. Intractable migraine with aura without status migrainosus  -     SUMAtriptan (IMITREX) 20 mg/actuation nasal spray; 1 Spray by Left Nostril route as needed for Migraine.  -     REFERRAL TO NEUROLOGY       If nasal sumatriptan as available or covered, will try Maxalt.  Discussed with patient possibility of needing daily medication such as topiramate           I have discussed the diagnosis with the patient and the intended plan as seen in the above orders.  The patient has received an after-visit summary and questions were answered concerning future plans.     Medication Side Effects  and Warnings were discussed with patient,  Patient Labs were reviewed and or requested, and  Patient Past Records were reviewed and or requested  yes       Pt agrees to call or return to clinic and/or go to closest ER with any worsening of symptoms.  This may include, but not limited to increased fever (>100.4) with NSAIDS or Tylenol, increased edema, confusion, rash, worsening of presenting symptoms.    Please note that this dictation was completed with Dragon, the computer voice recognition software.  Quite often unanticipated grammatical, syntax, homophones, and other interpretive errors are inadvertently transcribed by the computer software.  Please disregard these errors.  Please excuse any errors that have escaped final proofreading.  Thank you.    Reviewed and agree with Nurse Note and duplicated in this note.  Reviewed PmHx, RxHx, FmHx, SocHx, AllgHx and updated and dated in the chart.    No family history on file.    Past Medical History:   Diagnosis Date   ??? Alcohol abuse    ??? Depression    ??? Headache    ??? MI (myocardial infarction) (HCC)     x 4; no interventions      Social History     Socioeconomic History   ??? Marital status: LEGALLY SEPARATED   Tobacco Use   ??? Smoking status: Former Smoker     Packs/day: 0.25     Years: 12.00     Pack years: 3.00     Types: Cigarettes   ??? Smokeless tobacco: Never Used   Substance and Sexual Activity   ??? Alcohol use: Not Currently     Alcohol/week: 4.0 standard drinks     Types: 4 Cans of beer per week     Comment: sober 2021   ??? Drug use: Not Currently     Types: Cocaine        Review of Systems - negative except as listed above      Objective:     Vitals:    08/31/20 1148   BP: 109/78   Pulse: 82   Resp: 16   Temp: 98 ??F (36.7 ??C)   SpO2: 99%   Weight: 167 lb 1.6 oz (75.8 kg)   Height: 5\' 10"  (1.778 m)       Physical Examination: General appearance - alert, well appearing, and in no distress  Eyes - pupils  equal and reactive, extraocular eye movements intact  Ears -  bilateral TM's and external ear canals normal  Nose - normal and patent, no erythema, discharge or polyps  Mouth - mucous membranes moist, pharynx normal without lesions  Neck - supple, no significant adenopathy  Chest - clear to auscultation, no wheezes, rales or rhonchi, symmetric air entry  Heart - normal rate, regular rhythm, normal S1, S2, no murmurs, rubs, clicks or gallops  Abdomen - soft, nontender, nondistended, no masses or organomegaly  Neurological - alert, oriented, normal speech, no focal findings or movement disorder noted  Musculoskeletal - no joint tenderness, deformity or swelling  Extremities - peripheral pulses normal, no pedal edema, no clubbing or cyanosis  Skin - normal coloration and turgor, no rashes, no suspicious skin lesions noted     Assessment/ Plan:   Diagnoses and all orders for this visit:    1. Gastroesophageal reflux disease, unspecified whether esophagitis present  -     H PYLORI AB, IGG, QT; Future  -     famotidine (PEPCID) 40 mg tablet; Take 1 Tablet by mouth daily.    2. Intractable migraine with aura without status migrainosus  -     SUMAtriptan (IMITREX) 20 mg/actuation nasal spray; 1 Spray by Left Nostril route as needed for Migraine.  -     REFERRAL TO NEUROLOGY                   I have discussed the diagnosis with the patient and the intended plan as seen in the above orders.  The patient has received an after-visit summary and questions were answered concerning future plans.     Medication Side Effects and Warnings were discussed with patient,  Patient Labs were reviewed and or requested, and  Patient Past Records were reviewed and or requested  yes       Pt agrees to call or return to clinic and/or go to closest ER with any worsening of symptoms.  This may include, but not limited to increased fever (>100.4) with NSAIDS or Tylenol, increased edema, confusion, rash, worsening of presenting symptoms.      Please note that this dictation was completed with Dragon, the  computer voice recognition software.  Quite often unanticipated grammatical, syntax, homophones, and other interpretive errors are inadvertently transcribed by the computer software.  Please disregard these errors.  Please excuse any errors that have escaped final proofreading.  Thank you.

## 2020-08-31 NOTE — Addendum Note (Signed)
Addendum  Note by Tretha Sciara at 08/31/20 1045                Author: Tretha Sciara  Service: --  Author Type: --       Filed: 08/31/20 1216  Encounter Date: 08/31/2020  Status: Signed          Editor: Tretha Sciara          Addended by: Tretha Sciara on: 08/31/2020 12:16 PM    Modules accepted: Orders

## 2020-08-31 NOTE — Progress Notes (Signed)
The bacteria we tested for was normal or negative

## 2020-09-01 MED ORDER — RIZATRIPTAN 10 MG TAB, RAPID DISSOLVE
10 mg | ORAL_TABLET | Freq: Once | ORAL | 0 refills | Status: AC | PRN
Start: 2020-09-01 — End: 2020-09-01

## 2020-09-03 LAB — H PYLORI AB, IGG, QT
H PYLORI AB IGG: 0.61 Index Value (ref 0.00–0.79)
H pylori Ab IgG: 0.61 Index Value (ref 0.00–0.79)

## 2020-09-29 ENCOUNTER — Encounter: Attending: Family Medicine | Primary: Family Medicine

## 2020-12-27 ENCOUNTER — Inpatient Hospital Stay
Admit: 2020-12-27 | Discharge: 2020-12-28 | Disposition: A | Discharge status: Home / Self Care | Payer: MEDICAID | Attending: Emergency Medicine
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
## 2020-12-28 MED ORDER — CYCLOBENZAPRINE 10 MG TAB
10 mg | ORAL_TABLET | Freq: Three times a day (TID) | ORAL | 0 refills | Status: AC | PRN
Start: 2020-12-28 — End: ?

## 2020-12-28 MED ORDER — PREDNISONE 50 MG TAB
50 mg | ORAL_TABLET | Freq: Every day | ORAL | 0 refills | Status: DC
Start: 2020-12-28 — End: 2020-12-31

## 2020-12-31 ENCOUNTER — Ambulatory Visit: Attending: Family Medicine | Primary: Family Medicine

## 2020-12-31 ENCOUNTER — Encounter: Admit: 2020-12-31 | Payer: MEDICAID | Primary: Family Medicine

## 2020-12-31 DIAGNOSIS — S134XXA Sprain of ligaments of cervical spine, initial encounter: Secondary | ICD-10-CM

## 2020-12-31 MED ORDER — PREDNISONE 50 MG TAB
50 mg | ORAL_TABLET | Freq: Every day | ORAL | 0 refills | Status: AC
Start: 2020-12-31 — End: 2021-01-05
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
## 2021-01-04 LAB — GRAM STAIN
GRAM STAIN: NONE SEEN
GRAM STAIN: NONE SEEN
Gram Stain Result: NONE SEEN
Gram Stain Result: NONE SEEN

## 2021-01-07 DIAGNOSIS — M542 Cervicalgia: Secondary | ICD-10-CM
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
## 2021-01-09 LAB — CULTURE, EYE
Culture result:: NO GROWTH
Culture result:: NO GROWTH

## 2021-01-12 ENCOUNTER — Inpatient Hospital Stay: Admit: 2021-01-12 | Payer: MEDICAID | Primary: Family Medicine

## 2021-01-12 NOTE — Progress Notes (Signed)
 PT DAILY TREATMENT NOTE - MCR 2-15    Patient Name: Brittney Lawson  Date:01/12/2021  DOB: Feb 06, 1966  [x]   Patient DOB Verified  Payor: [employer]ICA[employer] / [place] [facility] [place] [place] / Product Type: Managed Care Medicaid /    In time: 8:14A  Out time: 9:15A  Total Treatment Time (min): 61  Total Timed Codes (min): 51  1:1 Treatment Time (MC only): 45   Visit #:  2    Treatment Area: Cervicalgia [M54.2]  Low back pain, unspecified [M54.50]  Sprain of ligaments of cervical spine, initial encounter [S13.4XXA]    SUBJECTIVE  Pain Level (0-10 scale): 0/10  Any medication changes, allergies to medications, adverse drug reactions, diagnosis change, or new procedure performed?: [x]  No    []  Yes (see summary sheet for update)  Subjective functional status/changes:   []  No changes reported  Pt repots no pain this morning just feeling stiff.     OBJECTIVE    Modality rationale: decrease edema, decrease inflammation, and decrease pain to improve the patient's ability to decrease neck pain   Min Type Additional Details      10 [x]  Estim: [] Att   [x] Unatt    [] TENS instruct                  [x] IFC  [] Premod   [] NMES                     [] Other:  [] w/US    [x] w/ice   [] w/heat  Position: supine  Location: neck       []   Traction: []  Cervical       [] Lumbar                       []  Prone          [] Supine                       [] Intermittent   [] Continuous Lbs:  []  before manual  []  after manual  [] w/heat    []   Ultrasound: [] Continuous   []  Pulsed                       at: []   [] Location:  W/cm2:    []  Paraffin         Location:   [] w/heat    []   Ice     []   Heat  []   Ice massage Position:  Location:    []   Laser  []   Other: Position:  Location:      []   Vasopneumatic Device Pressure:       []  lo []  med []  hi   Temperature:      [x]  Skin assessment post-treatment:  [x] intact [] redness- no adverse reaction    [] redness - adverse reaction:     41 min Therapeutic Exercise:  [x]  See flow sheet :   Rationale:  increase ROM, increase strength, improve coordination, improve balance, and increase proprioception to improve the patient's ability to increase function and mobility    10 min Manual Therapy: STM/MFR B UT, LS, cervical paraspinals, SPR, gentle manual distraction    Rationale: decrease pain, increase ROM, increase tissue extensibility, decrease edema , decrease trigger points, and increase postural awareness to improve the patient's ability to increase mobility    With   []  TE   []  TA   []  Neuro   []   SC   []  other: Patient Education: [x]  Review HEP    []  Progressed/Changed HEP based on:   []  positioning   []  body mechanics   []  transfers   []  heat/ice application    []  other:      Other Objective/Functional Measures: --     Pain Level (0-10 scale) post treatment: 0/10    ASSESSMENT/Changes in Function:   Pt reported good relief in stiffness with manual today. Tolerated addition of resistance for B ER well along with rows for improved scapular stabilization   Patient will continue to benefit from skilled PT services to modify and progress therapeutic interventions, address functional mobility deficits, address ROM deficits, address strength deficits, analyze and address soft tissue restrictions, analyze and cue movement patterns, analyze and modify body mechanics/ergonomics, and assess and modify postural abnormalities to attain remaining goals.     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   Frequency / Duration:  Patient to be seen 2 times per week for 4-6 weeks.    PLAN  []   Upgrade activities as tolerated     []   Continue plan of care  []   Update interventions per flow sheet       []   Discharge due to:_  []   Other:_      Benton KATHEE Sharps, PTA 01/12/2021

## 2021-01-14 ENCOUNTER — Inpatient Hospital Stay
Admit: 2021-01-14 | Payer: MEDICAID | Attending: Rehabilitative and Restorative Service Providers" | Primary: Family Medicine

## 2021-01-14 ENCOUNTER — Encounter: Attending: Family Medicine | Primary: Family Medicine

## 2021-01-14 NOTE — Progress Notes (Signed)
 PT DAILY TREATMENT NOTE - MCR 2-15    Patient Name: Brittney Lawson  Date:01/14/2021  DOB: January 18, 1966  [x]   Patient DOB Verified  Payor: [employer]OSS MEDICAID / [place] [facility] [place] / Product Type: Managed Care Medicaid /    In time: 8: 30A  Out time: 9 30A  Total Treatment Time (min): 60  Total Timed Codes (min): 45  1:1 Treatment Time (MC only): 38  Visit #:  3    Treatment Area: Cervicalgia [M54.2]  Low back pain, unspecified [M54.50]  Sprain of ligaments of cervical spine, initial encounter [S13.4XXA]    SUBJECTIVE  Pain Level (0-10 scale): 9/10  Any medication changes, allergies to medications, adverse drug reactions, diagnosis change, or new procedure performed?: [x]  No    []  Yes (see summary sheet for update)  Subjective functional status/changes:   []  No changes reported  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     OBJECTIVE    Modality rationale: decrease edema, decrease inflammation, and decrease pain to improve the patient's ability to decrease neck pain   Min Type Additional Details      15 [x]  Estim: [] Att   [x] Unatt    [] TENS instruct                  [] IFC  [x] Premod   [] NMES                     [] Other:  [] w/US    [x] w/ice   [] w/heat  Position: supine  Location: neck       []   Traction: []  Cervical       [] Lumbar                       []  Prone          [] Supine                       [] Intermittent   [] Continuous Lbs:  []  before manual  []  after manual  [] w/heat    []   Ultrasound: [] Continuous   []  Pulsed                       at: []   [] Location:  W/cm2:    []  Paraffin         Location:   [] w/heat    []   Ice     []   Heat  []   Ice massage Position:  Location:    []   Laser  []   Other: Position:  Location:      []   Vasopneumatic Device Pressure:       []  lo []  med []  hi   Temperature:      [x]  Skin assessment post-treatment:  [x] intact [] redness- no adverse reaction    [] redness - adverse reaction:     20 min Therapeutic Exercise:  [x]  See flow sheet  :   Rationale: increase ROM, increase strength, improve coordination, improve balance, and increase proprioception to improve the patient's ability to increase function and mobility    25 min Manual Therapy: STM/MFR B UT, LS, cervical paraspinals, SPR, gentle manual distraction    Rationale: decrease pain, increase ROM, increase tissue extensibility, decrease edema , decrease trigger points, and increase postural awareness to improve the patient's ability to increase mobility    With   []  TE   []  TA   []  Neuro   []   SC   []  other: Patient Education: [x]  Review HEP    []  Progressed/Changed HEP based on:   []  positioning   []  body mechanics   []  transfers   []  heat/ice application    []  other:      Other Objective/Functional Measures: --     Pain Level (0-10 scale) post treatment: 0/10    ASSESSMENT/Changes in Function:   Pt reports improvement following manual interventions today. Recommend  limiting her lifting during work activities to minimize flare ups in order to progress in PT.   Patient will continue to benefit from skilled PT services to modify and progress therapeutic interventions, address functional mobility deficits, address ROM deficits, address strength deficits, analyze and address soft tissue restrictions, analyze and cue movement patterns, analyze and modify body mechanics/ergonomics, and assess and modify postural abnormalities to attain remaining goals.     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    Frequency / Duration: Patient to be seen 2 times per week for 4-6 weeks.    PLAN  []   Upgrade activities as tolerated     [x]   Continue plan of care  []   Update interventions per flow sheet       []   Discharge due to:_  []   Other:_      Diane Dunn, PT, DPT 01/14/2021

## 2021-01-15 ENCOUNTER — Encounter: Attending: Family Medicine | Primary: Family Medicine

## 2021-01-18 ENCOUNTER — Inpatient Hospital Stay: Admit: 2021-01-18 | Payer: MEDICAID | Primary: Family Medicine

## 2021-01-18 NOTE — Progress Notes (Signed)
 PT DAILY TREATMENT NOTE - MCR 2-15    Patient Name: Brittney Lawson  Date:01/18/2021  DOB: 10/28/1965  [x]   Patient DOB Verified  Payor: [employer] MEDICAID / [place] [place] [employer] [place] / Product Type: Managed Care Medicaid /    In time: 1:45P Out time: 2:30P  Total Treatment Time (min): 45  Total Timed Codes (min): 45  1:1 Treatment Time (MC only): 41  Visit #:  4    Treatment Area: Cervicalgia [M54.2]  Low back pain, unspecified [M54.50]  Sprain of ligaments of cervical spine, initial encounter [S13.4XXA]    SUBJECTIVE  Pain Level (0-10 scale): 6.5/10  Any medication changes, allergies to medications, adverse drug reactions, diagnosis change, or new procedure performed?: [x]  No    []  Yes (see summary sheet for update)  Subjective functional status/changes:   []  No changes reported  Pt reports decrease in pain from last session. Does report not feeling well today. Feels like she has the virus that is going around now. She did do a teleheatlh visit. She stated Dr told her she was not contagious. Did take a COVID test yesterday and it was negative.      OBJECTIVE    25 min Therapeutic Exercise:  [x]  See flow sheet :   Rationale: increase ROM, increase strength, improve coordination, improve balance, and increase proprioception to improve the patient's ability to increase function and mobility    20 min Manual Therapy: STM/MFR B UT, LS, cervical paraspinals, SOR, gentle manual distraction    Rationale: decrease pain, increase ROM, increase tissue extensibility, decrease edema , decrease trigger points, and increase postural awareness to improve the patient's ability to increase mobility    With   []  TE   []  TA   []  Neuro   []  SC   []  other: Patient Education: [x]  Review HEP    []  Progressed/Changed HEP based on:   []  positioning   []  body mechanics   []  transfers   []  heat/ice application    []  other:      Other Objective/Functional Measures: --     Pain Level (0-10 scale) post treatment:  4/10    ASSESSMENT/Changes in Function:   Pt reported good relief with manual today. Declined modalities at end of session secondary to not feeling well. Did advise pt to use heat to help with pain and stiffness in neck at home.  Patient will continue to benefit from skilled PT services to modify and progress therapeutic interventions, address functional mobility deficits, address ROM deficits, address strength deficits, analyze and address soft tissue restrictions, analyze and cue movement patterns, analyze and modify body mechanics/ergonomics, and assess and modify postural abnormalities to attain remaining goals.     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   Frequency / Duration: Patient to be seen 2 times per week for 4-6  weeks.    PLAN  []   Upgrade activities as tolerated     [x]   Continue plan of care  []   Update interventions per flow sheet       []   Discharge due to:_  []   Other:_      Benton KATHEE Sharps, PTA 01/18/2021

## 2021-01-20 ENCOUNTER — Inpatient Hospital Stay
Admit: 2021-01-20 | Payer: MEDICAID | Attending: Rehabilitative and Restorative Service Providers" | Primary: Family Medicine

## 2021-01-20 NOTE — Progress Notes (Signed)
 PT DAILY TREATMENT NOTE - MCR 2-15    Patient Name: Brittney Lawson  Date:01/20/2021  DOB: 11/28/1965  [x]   Patient DOB Verified  Payor: [employer] MEDICAID / [place] [place] [employer] [place] / Product Type: Managed Care Medicaid /    In time: 2:00P Out time: 2:45P  Total Treatment Time (min): 45  Total Timed Codes (min): 45  1:1 Treatment Time (MC only): 41  Visit #:  5    Treatment Area: Cervicalgia [M54.2]  Low back pain, unspecified [M54.50]  Sprain of ligaments of cervical spine, initial encounter [S13.4XXA]    SUBJECTIVE  Pain Level (0-10 scale): 6.5/10  Any medication changes, allergies to medications, adverse drug reactions, diagnosis change, or new procedure performed?: [x]  No    []  Yes (see summary sheet for update)  Subjective functional status/changes:   []  No changes reported  Pt reports she has been working all day and feeling pain in her neck and shoulder. Numbness is coming back as well.      OBJECTIVE    30 min Therapeutic Exercise:  [x]  See flow sheet :   Rationale: increase ROM, increase strength, improve coordination, improve balance, and increase proprioception to improve the patient's ability to increase function and mobility    15 min Manual Therapy: STM/MFR B UT, LS, cervical paraspinals, SOR, gentle manual distraction    Rationale: decrease pain, increase ROM, increase tissue extensibility, decrease edema , decrease trigger points, and increase postural awareness to improve the patient's ability to increase mobility    With   []  TE   []  TA   []  Neuro   []  SC   []  other: Patient Education: [x]  Review HEP    []  Progressed/Changed HEP based on:   []  positioning   []  body mechanics   []  transfers   []  heat/ice application    []  other:      Other Objective/Functional Measures: --     Pain Level (0-10 scale) post treatment: 2/10    ASSESSMENT/Changes in Function:   Pt reported good relief with manual therapy and did not want to end with modality. 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   Patient will continue to benefit from skilled PT services to modify and progress therapeutic interventions, address functional mobility deficits, address ROM deficits, address strength deficits, analyze and address soft tissue restrictions, analyze and cue movement patterns, analyze and modify body mechanics/ergonomics, and assess and modify postural abnormalities to attain remaining goals.     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   Frequency / Duration: Patient to be seen 2 times per week for 4-6 weeks.    PLAN  []   Upgrade activities as tolerated     [x]   Continue plan of care  []   Update interventions per flow sheet       []   Discharge due to:_  []   Other:_      Diane Dunn, PT, DPT 01/20/2021
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
## 2021-01-25 ENCOUNTER — Ambulatory Visit: Admit: 2021-01-25 | Discharge: 2021-01-25 | Payer: MEDICAID | Attending: Family Medicine | Primary: Family Medicine

## 2021-01-25 ENCOUNTER — Inpatient Hospital Stay
Admit: 2021-01-25 | Payer: MEDICAID | Attending: Rehabilitative and Restorative Service Providers" | Primary: Family Medicine

## 2021-01-25 DIAGNOSIS — M5412 Radiculopathy, cervical region: Secondary | ICD-10-CM

## 2021-01-25 NOTE — Progress Notes (Signed)
Chief Complaint   Patient presents with    Shoulder Pain     Patient is here for a left shoulder pain.      she is a 55 y.o. year old female who presents for follow up of injury.     Follow Up Pain Assessment Encounter      Onset of Symptoms: Patient states she has had pain for months   ________________________________________________________________________  Description: Pain is  has worsened      Pain Scale:(1-10): 7  Duration:  continuous  Radiation: arm  What makes it better?: exercise and heat  What makes it worse?:strecthing  Medications tried: acetaminophen, ibuprofen  Modalities tried: PT        Reviewed and agree with Nurse Note and duplicated in this note.  Reviewed PmHx, RxHx, FmHx, SocHx, AllgHx and updated and dated in the chart.    No family history on file.    Past Medical History:   Diagnosis Date    Alcohol abuse     Depression     Headache     MI (myocardial infarction) (HCC)     x 4; no interventions      Social History     Socioeconomic History    Marital status: LEGALLY SEPARATED   Tobacco Use    Smoking status: Former     Packs/day: 0.25     Years: 12.00     Pack years: 3.00     Types: Cigarettes    Smokeless tobacco: Never   Substance and Sexual Activity    Alcohol use: Not Currently     Alcohol/week: 4.0 standard drinks     Types: 4 Cans of beer per week     Comment: sober 2021    Drug use: Not Currently     Types: Cocaine        Review of Systems - negative except as listed above      Objective:     Vitals:    01/25/21 1533   BP: 124/85   Pulse: 72   Resp: 16   Temp: 98 ??F (36.7 ??C)   SpO2: 98%   Weight: 165 lb (74.8 kg)   Height: 5\' 10"  (1.778 m)       Physical Examination: General appearance - alert, well appearing, and in no distress  Neck -pain with palpation of bilateral upper neck trap spasms, negative Spurling's bilaterally but does elicit pain  Back exam - tenderness noted bilateral paralumbar musculature, negative straight-leg raise bilaterally , normal reflexes and strength  bilateral lower extremities, sensory exam intact bilateral lower extremities  Neurological - alert, oriented, normal speech, no focal findings or movement disorder noted  Musculoskeletal - no joint tenderness, deformity or swelling  Extremities - peripheral pulses normal, no pedal edema, no clubbing or cyanosis  Skin - normal coloration and turgor, no rashes, no suspicious skin lesions noted      Assessment/ Plan:   Diagnoses and all orders for this visit:    1. Cervical radiculopathy  -     MRI CERV SPINE WO CONT; Future       Patient is feeling concerned measures which include physical therapy with worsening symptoms and radiculopathy, MRI to rule out stenosis    Pathophysiology, recovery and rehabilitation process discussed and questions answered   Counseling for 30 Minutes of the total visit duration   Pictures and figures used as necessary   Provided reassurance   Monitor response to home exercises   Recommend  lower impact  activities-walking, [employer], [employer], cycling or swimming               I have discussed the diagnosis with the patient and the intended plan as seen in the above orders.  The patient has received an after-visit summary and questions were answered concerning future plans.     Medication Side Effects and Warnings were discussed with patient,  Patient Labs were reviewed and or requested, and  Patient Past Records were reviewed and or requested  yes     Pt agrees to call or return to clinic and/or go to closest ER with any worsening of symptoms.  This may include, but not limited to increased fever (>100.4) with NSAIDS or Tylenol, increased edema, confusion, rash, worsening of presenting symptoms.    Please note that this dictation was completed with Dragon, the computer voice recognition software.  Quite often unanticipated grammatical, syntax, homophones, and other interpretive errors are inadvertently transcribed by the computer software.  Please disregard these errors.  Please excuse  any errors that have escaped final proofreading.  Thank you.

## 2021-01-25 NOTE — Progress Notes (Signed)
 PT DAILY TREATMENT NOTE - MCR 2-15    Patient Name: Brittney Lawson  Date:01/25/2021  DOB: 1966-01-02  [x]   Patient DOB Verified  Payor: [place]UE CROSS MEDICAID / [place] [employer] / Product Type: Managed Care Medicaid /    In time: 2:00P Out time: 300P  Total Treatment Time (min): 60  Total Timed Codes (min): 45  1:1 Treatment Time (MC only): 41  Visit #:  6    Treatment Area: Cervicalgia [M54.2]  Low back pain, unspecified [M54.50]  Sprain of ligaments of cervical spine, initial encounter [S13.4XXA]    SUBJECTIVE  Pain Level (0-10 scale): 6.5/10  Any medication changes, allergies to medications, adverse drug reactions, diagnosis change, or new procedure performed?: [x]  No    []  Yes (see summary sheet for update)  Subjective functional status/changes:   []  No changes reported  Pt reports  she woke up and her hand was numb, she was lying on her left side. Still feeling high levels of pain and is following up with MD following this session.     OBJECTIVE         Modality rationale: decrease edema, decrease inflammation, and decrease pain to improve the patient's ability to decrease neck pain    Min Type Additional Details      15 [x]  Estim: [] Att   [x] Unatt    [] TENS instruct                  [x] IFC  [] Premod   [] NMES                     [] Other:  [] w/US    [] w/ice   [x] w/heat  Position: supine  Location: neck        []   Traction: []  Cervical       [] Lumbar                       []  Prone          [] Supine                       [] Intermittent   [] Continuous Lbs:  []  before manual  []  after manual  [] w/heat     []   Ultrasound: [] Continuous   []  Pulsed                       at: []   [] Location:  W/cm2:     []  Paraffin         Location:   [] w/heat     []   Ice     []   Heat  []   Ice massage Position:  Location:     []   Laser  []   Other: Position:  Location:        []   Vasopneumatic Device Pressure:       []  lo []  med []  hi   Temperature:       [x]  Skin assessment post-treatment:  [x] intact  [] redness- no adverse reaction    [] redness - adverse reaction:   30 min Therapeutic Exercise:  [x]  See flow sheet :   Rationale: increase ROM, increase strength, improve coordination, improve balance, and increase proprioception to improve the patient's ability to increase function and mobility    20 min Manual Therapy: STM/MFR B UT, LS, cervical paraspinals, SOR, gentle manual distraction    Rationale: decrease pain, increase ROM, increase tissue extensibility, decrease edema ,  decrease trigger points, and increase postural awareness to improve the patient's ability to increase mobility    With   []  TE   []  TA   []  Neuro   []  SC   []  other: Patient Education: [x]  Review HEP    []  Progressed/Changed HEP based on:   []  positioning   []  body mechanics   []  transfers   []  heat/ice application    []  other:      Other Objective/Functional Measures:                                                  Cervical AROM:                                                                       R                      L                        Flexion                                     45                    -                         Extension                                 30 (20)             -                         Side Bending                           20 (18)             23 (15p!)                  Rotation                                   65(55)               60 (45p!)                        Pain Level (0-10 scale) post treatment: 2/10    ASSESSMENT/Changes in Function:   Pt has good improvement in ROM. Pain still noted at end ranges, however moving better. Pt return to MD this afternoon. She is frustrated at continued pain levels. Overall she does very well with PT interventions and feels great improvement following. Work related duties often  cause increased pain and soreness on a daily basis.   Patient will continue to benefit from skilled PT services to modify and progress therapeutic interventions, address functional  mobility deficits, address ROM deficits, address strength deficits, analyze and address soft tissue restrictions, analyze and cue movement patterns, analyze and modify body mechanics/ergonomics, and assess and modify postural abnormalities to attain remaining goals.     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   Frequency / Duration: Patient to be seen 2 times per week for 4-6 weeks.    PLAN  []   Upgrade activities as tolerated     [x]   Continue plan of care  []   Update interventions per flow sheet       []   Discharge due to:_  []   Other:_      Diane Dunn, PT, DPT 01/25/2021

## 2021-01-27 ENCOUNTER — Inpatient Hospital Stay
Admit: 2021-01-27 | Payer: MEDICAID | Attending: Rehabilitative and Restorative Service Providers" | Primary: Family Medicine

## 2021-01-27 MED ORDER — MELOXICAM 15 MG TAB
15 mg | ORAL_TABLET | Freq: Every day | ORAL | 1 refills | Status: AC
Start: 2021-01-27 — End: ?

## 2021-01-27 NOTE — Telephone Encounter (Signed)
Patient saw Dr. Warren Lacy on 01/25/21 and states Dr. Warren Lacy told her he would send Meloxicam for her shoulder pain.

## 2021-01-27 NOTE — Progress Notes (Signed)
 PT DAILY TREATMENT NOTE - MCR 2-15    Patient Name: Brittney Lawson  Date:01/27/2021  DOB: 1965-09-20  [x]   Patient DOB Verified  Payor: [employer]UE CROSS MEDICAID / [place] [place] [employer] [place] / Product Type: Managed Care Medicaid /    In time: 1000a Out time: 1055A  Total Treatment Time (min):55  Total Timed Codes (min): 40  1:1 Treatment Time (MC only): 38  Visit #:  7    Treatment Area: Cervicalgia [M54.2]  Low back pain, unspecified [M54.50]  Sprain of ligaments of cervical spine, initial encounter [S13.4XXA]    SUBJECTIVE  Pain Level (0-10 scale): 4/10  Any medication changes, allergies to medications, adverse drug reactions, diagnosis change, or new procedure performed?: [x]  No    []  Yes (see summary sheet for update)  Subjective functional status/changes:   []  No changes reported  Pt reports  she is doing better because she hasn't had to work the past few days. MD gave her a 2 week work note. MRI is scheduled for Monday.    OBJECTIVE         Modality rationale: decrease edema, decrease inflammation, and decrease pain to improve the patient's ability to decrease neck pain    Min Type Additional Details      15 [x]  Estim: [] Att   [x] Unatt    [] TENS instruct                  [x] IFC  [] Premod   [] NMES                     [] Other:  [] w/US    [] w/ice   [x] w/heat  Position: supine  Location: neck        []   Traction: []  Cervical       [] Lumbar                       []  Prone          [] Supine                       [] Intermittent   [] Continuous Lbs:  []  before manual  []  after manual  [] w/heat     []   Ultrasound: [] Continuous   []  Pulsed                       at: []   [] Location:  W/cm2:     []  Paraffin         Location:   [] w/heat     []   Ice     []   Heat  []   Ice massage Position:  Location:     []   Laser  []   Other: Position:  Location:        []   Vasopneumatic Device Pressure:       []  lo []  med []  hi   Temperature:       [x]  Skin assessment post-treatment:  [x] intact [] redness- no adverse  reaction    [] redness - adverse reaction:   25 min Therapeutic Exercise:  [x]  See flow sheet :   Rationale: increase ROM, increase strength, improve coordination, improve balance, and increase proprioception to improve the patient's ability to increase function and mobility    15 min Manual Therapy: STM/MFR B UT, LS, cervical paraspinals, SOR, gentle manual distraction    Rationale: decrease pain, increase ROM, increase tissue extensibility, decrease edema , decrease trigger points, and increase  postural awareness to improve the patient's ability to increase mobility    With   []  TE   []  TA   []  Neuro   []  SC   []  other: Patient Education: [x]  Review HEP    []  Progressed/Changed HEP based on:   []  positioning   []  body mechanics   []  transfers   []  heat/ice application    []  other:      Other Objective/Functional Measures:                                                  Cervical AROM:                                                                       R                      L                        Flexion                                     45                    -                         Extension                                 30 (20)             -                         Side Bending                           20 (18)             23 (15p!)                  Rotation                                   65(55)               60 (45p!)                        Pain Level (0-10 scale) post treatment: 2/10    ASSESSMENT/Changes in Function:   Pt did better today and was not so flared up due to heavy work activities. Felt improvement following manual therapy.   Patient will continue to benefit from skilled PT services to modify and progress therapeutic interventions, address functional mobility deficits, address ROM deficits, address strength deficits, analyze and address  soft tissue restrictions, analyze and cue movement patterns, analyze and modify body mechanics/ergonomics, and assess and modify postural abnormalities  to attain remaining goals.     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   Frequency / Duration: Patient to be seen 2 times per week for 4-6 weeks.    PLAN  []   Upgrade activities as tolerated     [x]   Continue plan of care  []   Update interventions per flow sheet       []   Discharge due to:_  []   Other:_      Diane Dunn, PT, DPT 01/27/2021

## 2021-02-01 ENCOUNTER — Inpatient Hospital Stay: Admit: 2021-02-01 | Payer: MEDICAID | Attending: Family Medicine | Primary: Family Medicine

## 2021-02-01 ENCOUNTER — Encounter

## 2021-02-01 ENCOUNTER — Inpatient Hospital Stay: Admit: 2021-02-01 | Payer: MEDICAID | Primary: Family Medicine

## 2021-02-01 DIAGNOSIS — M5412 Radiculopathy, cervical region: Secondary | ICD-10-CM

## 2021-02-01 DIAGNOSIS — M4802 Spinal stenosis, cervical region: Secondary | ICD-10-CM

## 2021-02-01 LAB — CULTURE, FUNGUS

## 2021-02-01 NOTE — Progress Notes (Signed)
 PT DAILY TREATMENT NOTE - MCR 2-15    Patient Name: Brittney Lawson  Date:02/01/2021  DOB: 10/23/1965  [x]   Patient DOB Verified  Payor: [place] MEDICAID / [place] [place] [employer] [place] / Product Type: Managed Care Medicaid /    In time: 3:15pm  Out time: 4:02pm  Total Treatment Time (min): 47  Total Timed Codes (min): 47  1:1 Treatment Time (MC only): 42   Visit #:  8    Treatment Area: Cervicalgia [M54.2]  Low back pain, unspecified [M54.50]  Sprain of ligaments of cervical spine, initial encounter [S13.4XXA]    SUBJECTIVE  Pain Level (0-10 scale): 2-3/10  Any medication changes, allergies to medications, adverse drug reactions, diagnosis change, or new procedure performed?: [x]  No    []  Yes (see summary sheet for update)  Subjective functional status/changes:   []  No changes reported  Pt stated that pain has decreased, since she hasn't been to work and feels much better. Had a MRI this morning.     OBJECTIVE    32 min Therapeutic Exercise:  [x]  See flow sheet :   Rationale: increase ROM, increase strength, improve coordination, improve balance, and increase proprioception to improve the patient's ability to increase function and mobility.  15 min Manual Therapy: STM/MFR B UT, LS, cervical paraspinals, SOR, gentle manual distraction     Rationale: decrease pain, increase ROM, increase tissue extensibility, decrease edema , decrease trigger points, and increase postural awareness to improve the patient's ability to increase mobility.             With   []  TE   []  TA   []  Neuro   []  SC   []  other: Patient Education: [x]  Review HEP    []  Progressed/Changed HEP based on:   []  positioning   []  body mechanics   []  transfers   []  heat/ice application    []  other:      Other Objective/Functional Measures: --     Pain Level (0-10 scale) post treatment: feeling better    ASSESSMENT/Changes in Function:   Pt was progressed to a blue theraband in rows/ext. Started to feel fatigued after the last reps and had  to verbalize to keep proper form.   Patient will continue to benefit from skilled PT services to modify and progress therapeutic interventions, address functional mobility deficits, address ROM deficits, address strength deficits, analyze and address soft tissue restrictions, analyze and cue movement patterns, analyze and modify body mechanics/ergonomics, assess and modify postural abnormalities, and instruct in home and community integration to attain remaining goals.     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   Frequency / Duration: Patient to be seen 2 times per week for 4-6 weeks.    PLAN  [x]   Upgrade activities as tolerated     [x]   Continue plan of care  []   Update interventions per flow sheet       []   Discharge due to:_  []   Other:_      Benton KATHEE Sharps, PTA 02/01/2021

## 2021-02-01 NOTE — Progress Notes (Signed)
You have multiple levels of degenerative disc disease most prominent at C5-C6.  I do recommend following up with a cervical physician to get a second opinion on this MRI.  I will put the referral in the system

## 2021-02-01 NOTE — Discharge Instructions (Signed)
Therapy Discharge by Carrington Clamp, PT, DPT at 02/01/21 1515                Author: Carrington Clamp, PT, DPT  Service: Physical Therapy  Author Type: Physical Therapist       Filed: 06/28/21 1310  Date of Service: 02/01/21 1515  Status: Signed           Editor: Carrington Clamp, PT, DPT (Physical Therapist)  Cosigner: [name], MD at 06/28/21 1604               Physical Therapy at [facility],    a part of [facility]   [address]   [place], [place] [address]   Phone: [phone]  Fax: [phone]      Medicaid Discharge Summary  2-15      Patient name: Brittney Lawson  DOB : Jun 16, 1965  Provider#: [id number]   Referral source: [name], MD       Medical/Treatment Diagnosis: Cervicalgia [M54.2]   Low back pain, unspecified [M54.50]   Sprain of ligaments of cervical spine, initial encounter [S13.4XXA]      Prior Hospitalization: see medical history      Comorbidities: depression   Prior Level of Function: gardening   Medications: Verified on Patient Summary List       Start of Care: 01/07/21                                                                    Onset Date: 12/27/20        Visits from Start of Care: 8    Missed Visits:  2   Reporting Period : 01/07/21 to 02/01/21         ASSESSMENT/SUMMARY OF CARE: Ms. Greulich was seen for 8 sessions regarding her neck pain. Failed to return after 02/01/21 session and will be considered discharged at this time. Instructed in HEP during  course of care.        RECOMMENDATIONS:   [x] Discontinue therapy: [] Patient has reached or is progressing toward set goals       [] Patient is non-compliant or has  abdicated       []  Due to lack of appreciable progress towards set goals      , PT, DPT 06/28/2021       ______________________________________________________________________      NOTE TO PHYSICIAN:  Please complete the following and fax to:   Physical Therapy at [facility], [facility] of [facility]. [facility]: Fax: [phone] .   [facility] Retain this original for your records.  If you are unable to process this request in 24 hours, please contact our office.       Physician's Signature:____________________  Date:____________Time:_________             [place], MD

## 2021-02-03 ENCOUNTER — Encounter: Payer: MEDICAID | Attending: Rehabilitative and Restorative Service Providers" | Primary: Family Medicine

## 2021-02-18 ENCOUNTER — Encounter: Payer: MEDICAID | Attending: Rehabilitative and Restorative Service Providers" | Primary: Family Medicine

## 2021-03-17 NOTE — Telephone Encounter (Signed)
Left message for patient to schedule mammogram ordered by Dr. Warren Lacy on 10/22.  Requested patient to return call to panel manager at [phone]  to schedule mammogram or notify that mammogram   has been completed at an outside facility.

## 2021-09-20 ENCOUNTER — Ambulatory Visit
Admit: 2021-09-20 | Discharge: 2021-09-20 | Payer: BLUE CROSS/BLUE SHIELD | Attending: Family Medicine | Primary: Family Medicine

## 2021-09-20 ENCOUNTER — Encounter: Payer: MEDICAID | Attending: Family Medicine | Primary: Family Medicine

## 2021-09-20 DIAGNOSIS — Z0000 Encounter for general adult medical examination without abnormal findings: Secondary | ICD-10-CM

## 2021-09-20 NOTE — Progress Notes (Signed)
Chief Complaint   Patient presents with    Follow-Up from Hospital     Patient states is here for a follow up    No chief complaint on file.    she is a 56 y.o. year old female who presents for CPE  Complete Physical Exam Questions:    LMP -  n/a  Last Pap (q 3 years, or q5 with HPV) - 2 year  Last Mammogram (50-74 biennially)- n/a  Hx of abnl Pap - No    1.  Do you follow a low fat diet?  yes  2.  Are you up to date on your Tdap (<10 years)?  Yes  3.  Have you ever had a Pneumovax vaccine (>65)?  No   PCV13 No   PPSV23 No  4.  Have you had Zoster vaccine (>60)? No  5.  Have you had the HPV - Gardasil (13- 26)? No  6.  Do you follow an exercise program?  yes  7.  Do you smoke?  no  8.  Do you consider yourself overweight?  yes  9.  Is there a family history of CAD< age 70? No  10.  Is there a family history of Cancer?  no  11.  Do you know your Cancer risks?  Yes  12.  Have you had a colonoscopy?  no  13. Have you been tested for HIV or other STI's? No HIV testing today(18-65 y/o)?No  14.  Have had a bone density scan or DEXA done(>65)?No  15.  Have you had an EKG performed in the last five years (>50)?  Yes    Other complaints:    Reviewed and agree with Nurse Note and duplicated in this note.  Reviewed PmHx, RxHx, FmHx, SocHx, AllgHx and updated and dated in the chart.    History reviewed. No pertinent family history.    Past Medical History:   Diagnosis Date    Alcohol abuse     Depression     Headache     MI (myocardial infarction) (HCC)     x 4.      Social History     Socioeconomic History    Marital status: LEGALLY SEPARATED   Tobacco Use    Smoking status: Former Smoker     Packs/day: 0.25     Years: 12.00     Pack years: 3.00     Types: Cigarettes    Smokeless tobacco: Never Used   Substance and Sexual Activity    Alcohol use: Not Currently     Alcohol/week: 4.0 standard drinks     Types: 4 Cans of beer per week    Drug use: No        Review of Systems - negative except as listed above      Objective:      Vitals:    02/24/20 1312   BP: 108/76   Pulse: (!) 101   Resp: 16   Temp: 98.3 F (36.8 C)   SpO2: 96%   Weight: 168 lb (76.2 kg)   Height: 5\' 10"  (1.778 m)       Physical Examination: General appearance - alert, well appearing, and in no distress  Eyes - pupils equal and reactive, extraocular eye movements intact  Ears - bilateral TM's and external ear canals normal  Nose - normal and patent, no erythema, discharge or polyps  Mouth - mucous membranes moist, pharynx normal without lesions  Neck - supple, no significant adenopathy  Chest - clear to auscultation, no wheezes, rales or rhonchi, symmetric air entry  Heart - normal rate, regular rhythm, normal S1, S2, no murmurs, rubs, clicks or gallops  Abdomen - soft, nontender, nondistended, no masses or organomegaly  Musculoskeletal - no joint tenderness, deformity or swelling  Extremities - peripheral pulses normal, no pedal edema, no clubbing or cyanosis  Skin - normal coloration and turgor, no rashes, no suspicious skin lesions noted      Assessment/ Plan:   Diagnoses and all orders for this visit:    1. Well woman exam (no gynecological exam)  -     CBC W/O DIFF; Future  -     LIPID PANEL; Future  -     METABOLIC PANEL, COMPREHENSIVE; Future    2. Encounter for screening mammogram for malignant neoplasm of breast  -     MAM MAMMO BI SCREENING INCL CAD; Future           Labs to be drawn: CBC, CMP, Lipid              she is a 56 y.o. year old female who presents for evaluation of  [facility] ED. Patient states she went to the ER for rib pain. Per patient after having xray's she was told that she has nodules on her lungs and the hospital will make an appointment with her PCP for her to follow up.  Patient has not had any of the imaging results or size of the nodules.    Reviewed and agree with Nurse Note and duplicated in this note.  Reviewed PmHx, RxHx, FmHx, SocHx, AllgHx and updated and dated in the chart.    No family history on file.    Past Medical  History:   Diagnosis Date    Alcohol abuse     Depression     Headache     MI (myocardial infarction) (HCC)     x 4; no interventions      Social History     Socioeconomic History    Marital status: Legally Separated   Tobacco Use    Smoking status: Former     Packs/day: 0.25     Types: Cigarettes    Smokeless tobacco: Never   Substance and Sexual Activity    Alcohol use: Not Currently     Alcohol/week: 4.0 standard drinks    Drug use: Not Currently     Types: Cocaine        Review of Systems - negative except as listed above      Objective:     Vitals:    09/20/21 1148   BP: 107/75   Pulse: 68   Resp: 16   Temp: 98.2 F (36.8 C)   SpO2: 98%   Weight: 135 lb (61.2 kg)   Height: 5\' 10"  (1.778 m)       Physical Examination: General appearance - alert, well appearing, and in no distress  Eyes - pupils equal and reactive, extraocular eye movements intact  Ears - bilateral TM's and external ear canals normal  Nose - normal and patent, no erythema, discharge or polyps  Mouth - mucous membranes moist, pharynx normal without lesions  Neck - supple, no significant adenopathy  Chest - clear to auscultation, no wheezes, rales or rhonchi, symmetric air entry  Heart - normal rate, regular rhythm, normal S1, S2, no murmurs, rubs, clicks or gallops  Abdomen - soft, nontender, nondistended, no masses or organomegaly  Neurological - alert, oriented, normal  speech, no focal findings or movement disorder noted  Musculoskeletal - no joint tenderness, deformity or swelling  Extremities - peripheral pulses normal, no pedal edema, no clubbing or cyanosis  Skin - normal coloration and turgor, no rashes, no suspicious skin lesions noted     Assessment/ Plan:   1. Well woman exam (no gynecological exam)  -     Comprehensive Metabolic Panel; Future  -     CBC with Auto Differential; Future  -     Lipid Panel; Future  -     HIV 1/2 Ag/Ab, 4TH Generation,W Rflx Confirm; Future  -     Hepatitis C Antibody; Future  2. Pulmonary nodules  3.  Encounter for screening mammogram for malignant neoplasm of breast  -     MAM TOMO DIGITAL DIAGNOSTIC BILATERAL; Future  4. Recurrent major depressive disorder, in partial remission (HCC)  -     TSH; Future  -     External Referral To Psychiatry  We will refer to psychiatry for better management of her depression.  Currently states that she is not suicidal but is not well controlled on Wellbutrin.    We will obtain records from [facility]. And repeat scan based on recommendations  Return if symptoms worsen or fail to improve.         I have discussed the diagnosis with the patient and the intended plan as seen in the above orders.  The patient has received an after-visit summary and questions were answered concerning future plans.     Medication Side Effects and Warnings were discussed with patient,  Patient Labs were reviewed and or requested, and  Patient Past Records were reviewed and or requested  yes       Pt agrees to call or return to clinic and/or go to closest ER with any worsening of symptoms.  This may include, but not limited to increased fever (>100.4) with NSAIDS or Tylenol, increased edema, confusion, rash, worsening of presenting symptoms.    Please note that this dictation was completed with Dragon, the computer voice recognition software.  Quite often unanticipated grammatical, syntax, homophones, and other interpretive errors are inadvertently transcribed by the computer software.  Please disregard these errors.  Please excuse any errors that have escaped final proofreading.  Thank you.

## 2021-09-20 NOTE — Addendum Note (Signed)
Addended by: [name] on: 09/20/2021 12:03 PM     Modules accepted: Level of Service

## 2021-10-03 ENCOUNTER — Emergency Department: Admit: 2021-10-04 | Payer: BLUE CROSS/BLUE SHIELD | Primary: Family Medicine

## 2021-10-03 DIAGNOSIS — G929 Unspecified toxic encephalopathy: Secondary | ICD-10-CM

## 2021-10-03 NOTE — ED Notes (Signed)
Brittney Lawson at bedside with search warrant for lab draw. Kit obtained from [employer], seal intact. Expiration date 05/05/2022. Pt provided verbal consent for lab draw. Skin cleansed with iodine prep pad provided in kit. Blood obtained from right hand x1 attempt. Specimens labeled and returned to kit. Kit sealed and returned to [employer]. Time of lab draw: 2113.     Quinn Axe, RN  10/03/21 2155

## 2021-10-03 NOTE — H&P (Signed)
Hospitalist Admission Note    NAME:   Brittney Lawson   DOB: Mar 05, 1966   MRN: [id number]     Date/Time: 10/03/2021 11:05 PM    Patient PCP: [name], MD    ______________________________________________________________________  Given the patient's current clinical presentation, I have a high level of concern for decompensation if discharged from the emergency department.  Complex decision making was performed, which includes reviewing the patient's available past medical records, laboratory results, and x-ray films.       My assessment of this patient's clinical condition and my plan of care is as follows.    Assessment / Plan:    Altered mental status/drug overdose-of note patient presents with altered mental status, likely secondary to polypharmacy, unintentional versus intentional drug overdose, currently patient is protecting airway, doing well on room air  Follow-up drug screen  Hold all home medications at this time  Continue to monitor mental status  Every 4 hour EKGs to assess for QT interval    Hypokalemia-replete potassium and recheck potassium    Depression/anxiety-currently holding home medications in setting of altered mental status/unintentional versus intentional drug overdose  Can consider reinitiation versus psychiatry consult in a.m. once patient's mental status improves    Prophylaxis-Lovenox  FEN-n.p.o. until mental status improves, maintenance IV fluids, replete potassium and magnesium  Full code by default, will clarify about surrogate decision maker in a.m., admitted for observation and further management                    Medical Decision Making:   I personally reviewed labs: CBC, CMP  I personally reviewed imaging: CT head, agree with current read  I personally reviewed EKG:  Toxic drug monitoring: Lovenox, monitoring for heparin-induced thrombocytopenia, bleeding, with daily CBCs  Discussed case with: ED provider. After discussion I am in agreement that acuity of patient's medical  condition necessitates hospital stay.      Code Status: Full code  DVT Prophylaxis: Lovenox  GI Prophylaxis: None  Baseline: Independent with ADLs according to records    Subjective:   CHIEF COMPLAINT: Altered mental status    HISTORY OF PRESENT ILLNESS:     Brittney Lawson is a 56 y.o.  female with PMHx significant for multiple comorbidities presents to [facility] with altered mental status.  Of note patient was found slumped over in her car with multiple pill bottles, and was brought in by law enforcement to the ED.  Of note patient is currently somnolent, unable to obtain history review of systems secondary to patient's clinical status.  We were asked to admit for work up and evaluation of the above problems.     Past Medical History:   Diagnosis Date    Alcohol abuse     Depression     Headache     MI (myocardial infarction) ([place])     x 4; no interventions        Past Surgical History:   Procedure Laterality Date    COLONOSCOPY N/A 07/06/2020    COLONOSCOPY performed by Orrin Brigham, MD at [facility] ENDOSCOPY    KNEE ARTHROSCOPY Left        Social History     Tobacco Use    Smoking status: Former     Packs/day: 0.25     Types: Cigarettes    Smokeless tobacco: Never   Substance Use Topics    Alcohol use: Not Currently     Alcohol/week: 4.0 standard drinks  No family history on file.    Allergies   Allergen Reactions    Meloxicam Diarrhea and Nausea And Vomiting     GI distress, diarrhea  GI distress, diarrhea          Prior to Admission medications    Medication Sig Start Date End Date Taking? Authorizing Provider   buPROPion [facility] SR) 150 MG extended release tablet ceived the following from [place] [facility]: Outside name: buPROPion SR [facility] SR) 150 mg SR tablet 02/17/20   Ar Automatic Reconciliation   SUMAtriptan (IMITREX) 20 MG/ACT nasal spray 1 spray by Nasal route as needed 08/31/20   Ar Automatic Reconciliation   topiramate (TOPAMAX) 50 MG tablet Take by mouth  2 times daily    Ar Automatic Reconciliation         Objective:   VITALS:    Patient Vitals for the past 24 hrs:   BP Temp Temp src Pulse Resp SpO2   10/03/21 2230 101/70 -- -- 72 16 90 %   10/03/21 2215 102/72 -- -- 73 16 --   10/03/21 2200 100/67 -- -- 76 18 90 %   10/03/21 2130 115/71 -- -- 82 19 93 %   10/03/21 2115 119/79 -- -- 81 20 (!) 82 %   10/03/21 2100 (!) 101/58 -- -- 75 14 90 %   10/03/21 2045 129/79 -- -- 94 29 95 %   10/03/21 2041 130/76 98.5 F (36.9 C) Oral 76 17 95 %       Temp (24hrs), Avg:98.5 F (36.9 C), Min:98.5 F (36.9 C), Max:98.5 F (36.9 C)             Wt Readings from Last 12 Encounters:   09/20/21 61.2 kg (135 lb)   01/25/21 74.8 kg (165 lb)   12/31/20 76.7 kg (169 lb)   08/31/20 75.8 kg (167 lb 1.6 oz)   02/24/20 76.2 kg (168 lb)         PHYSICAL EXAM:  General:    Patient is somnolent, does respond to painful stimuli     Lungs:   CTA b/l.  No wheezing or Rhonchi. No rales.  Chest wall:  No tenderness.  No accessory muscle use.  Heart:   Regular  rhythm,  No  Murmur.   No edema  Abdomen:   Soft, NT. ND  BS+  Extremities: No cyanosis.  No clubbing,      Skin turgor normal, Radial dial pulse 2+. Capillary refill normal  Neurologic: Limited secondary to patient's clinical status        LAB DATA REVIEWED:    Recent Results (from the past 12 hour(s))   CBC with Auto Differential    Collection Time: 10/03/21  9:20 PM   Result Value Ref Range    WBC 10.7 3.6 - 11.0 K/uL    RBC 4.16 3.80 - 5.20 M/uL    Hemoglobin 12.4 11.5 - 16.0 g/dL    Hematocrit 35.4 35.0 - 47.0 %    MCV 85.1 80.0 - 99.0 FL    MCH 29.8 26.0 - 34.0 PG    MCHC 35.0 30.0 - 36.5 g/dL    RDW 12.4 11.5 - 14.5 %    Platelets 231 150 - 400 K/uL    MPV 10.7 8.9 - 12.9 FL    Nucleated RBCs 0.0 0 PER 100 WBC    nRBC 0.00 0.00 - 0.01 K/uL    Neutrophils % 39 32 - 75 %    Lymphocytes %  48 12 - 49 %    Monocytes % 10 5 - 13 %    Eosinophils % 2 0 - 7 %    Basophils % 1 0 - 1 %    Immature Granulocytes 0 0.0 - 0.5 %    Neutrophils  Absolute 4.2 1.8 - 8.0 K/UL    Lymphocytes Absolute 5.1 (H) 0.8 - 3.5 K/UL    Monocytes Absolute 1.1 (H) 0.0 - 1.0 K/UL    Eosinophils Absolute 0.3 0.0 - 0.4 K/UL    Basophils Absolute 0.1 0.0 - 0.1 K/UL    Absolute Immature Granulocyte 0.0 0.00 - 0.04 K/UL    Differential Type AUTOMATED     CMP    Collection Time: 10/03/21  9:20 PM   Result Value Ref Range    Sodium 140 136 - 145 mmol/L    Potassium 3.5 3.5 - 5.1 mmol/L    Chloride 108 97 - 108 mmol/L    CO2 26 21 - 32 mmol/L    Anion Gap 6 5 - 15 mmol/L    Glucose 114 (H) 65 - 100 mg/dL    BUN 33 (H) 6 - 20 MG/DL    Creatinine 1.02 0.55 - 1.02 MG/DL    Bun/Cre Ratio 32 (H) 12 - 20      Est, Glom Filt Rate >60 >60 ml/min/1.61m    Calcium 8.9 8.5 - 10.1 MG/DL    Total Bilirubin 0.2 0.2 - 1.0 MG/DL    ALT 49 12 - 78 U/L    AST 30 15 - 37 U/L    Alk Phosphatase 64 45 - 117 U/L    Total Protein 6.8 6.4 - 8.2 g/dL    Albumin 3.5 3.5 - 5.0 g/dL    Globulin 3.3 2.0 - 4.0 g/dL    Albumin/Globulin Ratio 1.1 1.1 - 2.2     ETOH    Collection Time: 10/03/21  9:20 PM   Result Value Ref Range    Ethanol Lvl <10 <10 MG/DL   TSH    Collection Time: 10/03/21  9:20 PM   Result Value Ref Range    TSH, 3RD GENERATION 1.37 0.36 - 3.74 uIU/mL   Acetaminophen Level    Collection Time: 10/03/21  9:20 PM   Result Value Ref Range    Acetaminophen Level <2 (L) 10 - 30 ug/mL   Salicylate    Collection Time: 10/03/21  9:20 PM   Result Value Ref Range    Salicylate, Serum <<2.4(L) 2.8 - 20.0 MG/DL   Extra Tubes Hold    Collection Time: 10/03/21  9:20 PM   Result Value Ref Range    Specimen HOld RED     Comment:        Add-on orders for these samples will be processed based on acceptable specimen integrity and analyte stability, which may vary by analyte.         CT HEAD WO CONTRAST    Result Date: 10/03/2021  EXAM: CT HEAD WO CONTRAST INDICATION: AMS COMPARISON: 12/11/2010. CONTRAST: None. TECHNIQUE: Unenhanced CT of the head was performed using 5 mm images. Brain and bone windows were  generated. Coronal and sagittal reformats. CT dose reduction was achieved through use of a standardized protocol tailored for this examination and automatic exposure control for dose modulation.  FINDINGS: The ventricles and sulci are normal in size, shape and configuration. There is no significant white matter disease. There is no intracranial hemorrhage, extra-axial collection, or mass effect. The basilar cisterns are  open. No CT evidence of acute infarct. The bone windows demonstrate no abnormalities. The visualized portions of the paranasal sinuses and mastoid air cells are clear.     No acute abnormality.     XR CHEST PORTABLE    Result Date: 10/03/2021  EXAM:  XR CHEST PORTABLE INDICATION: Confusion COMPARISON: none TECHNIQUE: portable chest AP view FINDINGS: The cardiac silhouette is within normal limits. The pulmonary vasculature is within normal limits. The lungs and pleural spaces are clear. The visualized bones and upper abdomen are age-appropriate.     No acute process on portable chest.      _______________________________________________________________________    TOTAL TIME:  76 Minutes    Critical Care Provided     Minutes non procedure based    Signed: Lalena Salas Ned Card, MD    Procedures: see electronic medical records for all procedures/Xrays and details which were not copied into this note but were reviewed prior to creation of Plan.
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
## 2021-10-04 ENCOUNTER — Inpatient Hospital Stay
Admit: 2021-10-04 | Discharge: 2021-10-05 | Disposition: A | Discharge status: Home / Self Care | Payer: BLUE CROSS/BLUE SHIELD | Admitting: Student in an Organized Health Care Education/Training Program

## 2021-10-04 LAB — ETHANOL: Ethanol Lvl: <10 MG/DL (ref <10)

## 2021-10-04 LAB — CBC WITH AUTO DIFFERENTIAL
Absolute Immature Granulocyte: 0 10*3/uL (ref 0.00–0.04)
Absolute Immature Granulocyte: 0.1 10*3/uL — ABNORMAL HIGH (ref 0.00–0.04)
Basophils %: 1 % (ref 0–1)
Basophils %: 1 % (ref 0–1)
Basophils Absolute: 0.1 10*3/uL (ref 0.0–0.1)
Basophils Absolute: 0.1 10*3/uL (ref 0.0–0.1)
Eosinophils %: 2 % (ref 0–7)
Eosinophils %: 2 % (ref 0–7)
Eosinophils Absolute: 0.3 10*3/uL (ref 0.0–0.4)
Eosinophils Absolute: 0.2 10*3/uL (ref 0.0–0.4)
Hematocrit: 35.4 % (ref 35.0–47.0)
Hematocrit: 39 % (ref 35.0–47.0)
Hemoglobin: 12.4 g/dL (ref 11.5–16.0)
Hemoglobin: 13 g/dL (ref 11.5–16.0)
Immature Granulocytes: 0 % (ref 0.0–0.5)
Immature Granulocytes: 0 % (ref 0.0–0.5)
Lymphocytes %: 48 % (ref 12–49)
Lymphocytes %: 40 % (ref 12–49)
Lymphocytes Absolute: 5.1 10*3/uL — ABNORMAL HIGH (ref 0.8–3.5)
Lymphocytes Absolute: 4.8 10*3/uL — ABNORMAL HIGH (ref 0.8–3.5)
MCH: 29.8 PG (ref 26.0–34.0)
MCH: 29.3 PG (ref 26.0–34.0)
MCHC: 35 g/dL (ref 30.0–36.5)
MCHC: 33.3 g/dL (ref 30.0–36.5)
MCV: 85.1 FL (ref 80.0–99.0)
MCV: 88 FL (ref 80.0–99.0)
MPV: 10.7 FL (ref 8.9–12.9)
MPV: 11.2 FL (ref 8.9–12.9)
Monocytes %: 10 % (ref 5–13)
Monocytes %: 10 % (ref 5–13)
Monocytes Absolute: 1.1 10*3/uL — ABNORMAL HIGH (ref 0.0–1.0)
Monocytes Absolute: 1.2 10*3/uL — ABNORMAL HIGH (ref 0.0–1.0)
Neutrophils %: 39 % (ref 32–75)
Neutrophils %: 47 % (ref 32–75)
Neutrophils Absolute: 4.2 10*3/uL (ref 1.8–8.0)
Neutrophils Absolute: 5.8 10*3/uL (ref 1.8–8.0)
Nucleated RBCs: 0 PER 100 WBC
Nucleated RBCs: 0 PER 100 WBC
Platelets: 231 10*3/uL (ref 150–400)
Platelets: 238 10*3/uL (ref 150–400)
RBC: 4.16 M/uL (ref 3.80–5.20)
RBC: 4.43 M/uL (ref 3.80–5.20)
RDW: 12.4 % (ref 11.5–14.5)
RDW: 12.7 % (ref 11.5–14.5)
WBC: 10.7 10*3/uL (ref 3.6–11.0)
WBC: 12.2 10*3/uL — ABNORMAL HIGH (ref 3.6–11.0)
nRBC: 0 10*3/uL (ref 0.00–0.01)
nRBC: 0 10*3/uL (ref 0.00–0.01)

## 2021-10-04 LAB — COMPREHENSIVE METABOLIC PANEL
ALT: 49 U/L (ref 12–78)
AST: 30 U/L (ref 15–37)
Albumin/Globulin Ratio: 1.1 (ref 1.1–2.2)
Albumin: 3.5 g/dL (ref 3.5–5.0)
Alk Phosphatase: 64 U/L (ref 45–117)
Anion Gap: 6 mmol/L (ref 5–15)
BUN: 33 MG/DL — ABNORMAL HIGH (ref 6–20)
Bun/Cre Ratio: 32 — ABNORMAL HIGH (ref 12–20)
CO2: 26 mmol/L (ref 21–32)
Calcium: 8.9 MG/DL (ref 8.5–10.1)
Chloride: 108 mmol/L (ref 97–108)
Creatinine: 1.02 MG/DL (ref 0.55–1.02)
Est, Glom Filt Rate: >60 mL/min/{1.73_m2} (ref >60)
Globulin: 3.3 g/dL (ref 2.0–4.0)
Glucose: 114 mg/dL — ABNORMAL HIGH (ref 65–100)
Potassium: 3.5 mmol/L (ref 3.5–5.1)
Sodium: 140 mmol/L (ref 136–145)
Total Bilirubin: 0.2 MG/DL (ref 0.2–1.0)
Total Protein: 6.8 g/dL (ref 6.4–8.2)

## 2021-10-04 LAB — URINALYSIS WITH REFLEX TO CULTURE
BACTERIA, URINE: NEGATIVE /hpf
Bilirubin Urine: NEGATIVE
Blood, Urine: NEGATIVE
Glucose, UA: NEGATIVE mg/dL
Ketones, Urine: NEGATIVE mg/dL
Leukocyte Esterase, Urine: NEGATIVE
Nitrite, Urine: NEGATIVE
Protein, UA: NEGATIVE mg/dL
Specific Gravity, UA: 1.008
Urobilinogen, Urine: 0.2 EU/dL (ref 0.2–1.0)
pH, Urine: 5.5 (ref 5.0–8.0)

## 2021-10-04 LAB — URINE DRUG SCREEN
Amphetamine, Urine: NEGATIVE
Barbiturates, Urine: NEGATIVE
Benzodiazepines, Urine: NEGATIVE
Cocaine, Urine: POSITIVE — AB
Methadone, Urine: NEGATIVE
Opiates, Urine: NEGATIVE
PCP, Urine: NEGATIVE
THC, TH-Cannabinol, Urine: NEGATIVE

## 2021-10-04 LAB — COMPREHENSIVE METABOLIC PANEL W/ REFLEX TO MG FOR LOW K
ALT: 48 U/L (ref 12–78)
AST: 33 U/L (ref 15–37)
Albumin/Globulin Ratio: 1.1 (ref 1.1–2.2)
Albumin: 3.5 g/dL (ref 3.5–5.0)
Alk Phosphatase: 62 U/L (ref 45–117)
Anion Gap: 4 mmol/L — ABNORMAL LOW (ref 5–15)
BUN: 24 MG/DL — ABNORMAL HIGH (ref 6–20)
Bun/Cre Ratio: 27 — ABNORMAL HIGH (ref 12–20)
CO2: 24 mmol/L (ref 21–32)
Calcium: 9 MG/DL (ref 8.5–10.1)
Chloride: 114 mmol/L — ABNORMAL HIGH (ref 97–108)
Creatinine: 0.89 MG/DL (ref 0.55–1.02)
Est, Glom Filt Rate: >60 mL/min/{1.73_m2} (ref >60)
Globulin: 3.3 g/dL (ref 2.0–4.0)
Glucose: 111 mg/dL — ABNORMAL HIGH (ref 65–100)
Potassium: 4.4 mmol/L (ref 3.5–5.1)
Sodium: 142 mmol/L (ref 136–145)
Total Bilirubin: 0.8 MG/DL (ref 0.2–1.0)
Total Protein: 6.8 g/dL (ref 6.4–8.2)

## 2021-10-04 LAB — EXTRA TUBES HOLD

## 2021-10-04 LAB — TSH: TSH, 3RD GENERATION: 1.37 u[IU]/mL (ref 0.36–3.74)

## 2021-10-04 LAB — ACETAMINOPHEN LEVEL: Acetaminophen Level: <2 ug/mL — ABNORMAL LOW (ref 10–30)

## 2021-10-04 LAB — SALICYLATE LEVEL: Salicylate, Serum: <1.7 MG/DL — ABNORMAL LOW (ref 2.8–20.0)

## 2021-10-04 LAB — MAGNESIUM: Magnesium: 2.3 mg/dL (ref 1.6–2.4)

## 2021-10-04 MED ORDER — ACETAMINOPHEN 650 MG RE SUPP
650 | Freq: Four times a day (QID) | RECTAL | Status: DC | PRN
Start: 2021-10-04 — End: 2021-10-05

## 2021-10-04 MED ORDER — NORMAL SALINE FLUSH 0.9 % IV SOLN
0.9 % | Freq: Two times a day (BID) | INTRAVENOUS | Status: AC
Start: 2021-10-04 — End: 2021-10-05
  Administered 2021-10-04 – 2021-10-05 (×2): 10 mL via INTRAVENOUS

## 2021-10-04 MED ORDER — POTASSIUM CHLORIDE ER 10 MEQ PO TBCR
10 MEQ | Freq: Once | ORAL | Status: AC
Start: 2021-10-04 — End: 2021-10-04
  Administered 2021-10-04: 04:00:00 40 meq via ORAL

## 2021-10-04 MED ORDER — MAGNESIUM SULFATE 2000 MG/50 ML IVPB PREMIX
2 GM/50ML | INTRAVENOUS | Status: AC | PRN
Start: 2021-10-04 — End: 2021-10-05

## 2021-10-04 MED ORDER — PLACEHOLDER
Status: AC
Start: 2021-10-04 — End: 2021-10-04

## 2021-10-04 MED ORDER — ONDANSETRON 4 MG PO TBDP
4 MG | Freq: Three times a day (TID) | ORAL | Status: AC | PRN
Start: 2021-10-04 — End: 2021-10-05

## 2021-10-04 MED ORDER — ENOXAPARIN SODIUM 40 MG/0.4ML IJ SOSY
40 MG/0.4ML | Freq: Every day | INTRAMUSCULAR | Status: DC
Start: 2021-10-04 — End: 2021-10-03

## 2021-10-04 MED ORDER — BUPROPION HCL ER (SR) 150 MG PO TB12
150 MG | Freq: Every day | ORAL | Status: AC
Start: 2021-10-04 — End: 2021-10-05
  Administered 2021-10-04: 16:00:00 150 mg via ORAL

## 2021-10-04 MED ORDER — ONDANSETRON HCL 4 MG/2ML IJ SOLN
4 MG/2ML | Freq: Four times a day (QID) | INTRAMUSCULAR | Status: AC | PRN
Start: 2021-10-04 — End: 2021-10-05

## 2021-10-04 MED ORDER — ENOXAPARIN SODIUM 40 MG/0.4ML IJ SOSY
40 MG/0.4ML | Freq: Every day | INTRAMUSCULAR | Status: AC
Start: 2021-10-04 — End: 2021-10-05
  Administered 2021-10-04: 14:00:00 40 mg via SUBCUTANEOUS

## 2021-10-04 MED ORDER — ACETAMINOPHEN 325 MG PO TABS
325 | Freq: Four times a day (QID) | ORAL | Status: DC | PRN
Start: 2021-10-04 — End: 2021-10-05

## 2021-10-04 MED ORDER — FLUTICASONE PROPIONATE 50 MCG/ACT NA SUSP
50 MCG/ACT | Freq: Every day | NASAL | Status: AC
Start: 2021-10-04 — End: 2021-10-05
  Administered 2021-10-04: 21:00:00 2 via NASAL

## 2021-10-04 MED ORDER — NALOXONE HCL 0.4 MG/ML IJ SOLN
0.4 MG/ML | Freq: Once | INTRAMUSCULAR | Status: AC
Start: 2021-10-04 — End: 2021-10-03
  Administered 2021-10-04: 02:00:00 0.4 mg via INTRAVENOUS

## 2021-10-04 MED ORDER — LURASIDONE HCL 40 MG PO TABS
40 MG | Freq: Every day | ORAL | Status: AC
Start: 2021-10-04 — End: 2021-10-05
  Administered 2021-10-04: 21:00:00 40 mg via ORAL

## 2021-10-04 MED ORDER — POLYETHYLENE GLYCOL 3350 17 G PO PACK
17 g | Freq: Every day | ORAL | Status: AC | PRN
Start: 2021-10-04 — End: 2021-10-05

## 2021-10-04 MED ORDER — NORMAL SALINE FLUSH 0.9 % IV SOLN
0.9 % | INTRAVENOUS | Status: AC | PRN
Start: 2021-10-04 — End: 2021-10-05

## 2021-10-04 MED ORDER — SODIUM CHLORIDE 0.9 % IV SOLN
0.9 % | INTRAVENOUS | Status: AC
Start: 2021-10-04 — End: 2021-10-05
  Administered 2021-10-04 – 2021-10-05 (×3): via INTRAVENOUS

## 2021-10-04 MED ORDER — SODIUM CHLORIDE 0.9 % IV SOLN
0.9 % | INTRAVENOUS | Status: AC | PRN
Start: 2021-10-04 — End: 2021-10-05

## 2021-10-04 MED FILL — NALOXONE HCL 0.4 MG/ML IJ SOLN: 0.4 MG/ML | INTRAMUSCULAR | Qty: 1

## 2021-10-04 MED FILL — BUPROPION HCL ER (SMOKING DET) 150 MG PO TB12: 150 MG | ORAL | Qty: 1

## 2021-10-04 MED FILL — LATUDA 40 MG PO TABS: 40 MG | ORAL | Qty: 1

## 2021-10-04 MED FILL — ENOXAPARIN SODIUM 40 MG/0.4ML IJ SOSY: 40 MG/0.4ML | INTRAMUSCULAR | Qty: 0.4

## 2021-10-04 MED FILL — POTASSIUM CHLORIDE ER 10 MEQ PO TBCR: 10 MEQ | ORAL | Qty: 4

## 2021-10-04 MED FILL — FLUTICASONE PROPIONATE 50 MCG/ACT NA SUSP: 50 MCG/ACT | NASAL | Qty: 16

## 2021-10-04 NOTE — Progress Notes (Signed)
0740: Bedside and verbal report completed by Sheilah Pigeon, RN. Pt lying in bed with eyes closed.    5621: Assessment completed. EKG done and placed in chart.     3086: Pt sleeping. Denies complaints.     1024: Notified Dr. Sharma Covert that patient is awake and no longer lethargic. Regular diet ordered. Pt updated.     1100: Kim the tech gave patient snacks while waiting for lunch tray.     1145: EKG completed and placed in chart.     1203: Reassessment completed. Pt denies complaints.     1306: Zoom call with Hennie Duos, MD (psych) completed at bedside.     1410: Pt lying in bed. Pt finished eating her lunch. Pt repositioned herself.     1600: EKG completed and placed in chart. QTC normal. Per Dr. Sharma Covert, okay to discontinue q4h EKG order.     1625: Notified Jae Dire from poison control of patient's inpatient status. RN gave patient information and Jae Dire advised she will file the report.     1711: Pt awake lying in bed. Visitor at bedside. Pt denies complaints.     1922: Bedside and verbal report completed by Trellis Moment RN to [facility] RN.

## 2021-10-04 NOTE — Consults (Signed)
PSYCHIATRY CONSULT NOTE    REASON FOR CONSULT:  "I had a DUI last night"    HISTORY OF PRESENTING COMPLAINT:  Brittney Lawson is a 56 y.o. White (non-Hispanic) female who is currently admitted to the medical floor at [facility]. Patient reports that she was recently placed on new medications and she has been [place] for a couple weeks to little over a month.Patient appears lethargic during consult and she needed multiple prompts due to inattentiveness.      PAST PSYCHIATRIC HISTORY and SUBSTANCE ABUSE HISTORY:  Previous psychiatric admissions reports "I had anger issues". She reports that she is not angry anymore and her daughter passed away about three years ago in September. Patient had a therapist but her therapist has moved out of state. Her last admission was about two years ago to inpatient.   Elmore Guise is her provider at [facility]".       PAST MEDICAL HISTORY:    Please see H&P for details.     Past Medical History:   Diagnosis Date    Alcohol abuse     Depression     Headache     MI (myocardial infarction) (HCC)     x 4; no interventions     Prior to Admission medications    Medication Sig Start Date End Date Taking? Authorizing Provider   buPROPion [facility] SR) 150 MG extended release tablet ceived the following from [facility]: Outside name: buPROPion SR [facility] SR) 150 mg SR tablet 02/17/20   Ar Automatic Reconciliation   SUMAtriptan (IMITREX) 20 MG/ACT nasal spray 1 spray by Nasal route as needed 08/31/20   Ar Automatic Reconciliation   topiramate (TOPAMAX) 50 MG tablet Take by mouth 2 times daily    Ar Automatic Reconciliation     @DCDT @  Lab Results   Component Value Date    WBC 12.2 (H) 10/04/2021    HGB 13.0 10/04/2021    HCT 39.0 10/04/2021    MCV 88.0 10/04/2021    PLT 238 10/04/2021     Lab Results   Component Value Date    NA 142 10/04/2021    K 4.4 10/04/2021    CL 114 (H) 10/04/2021    CO2 24 10/04/2021    BUN 24 (H) 10/04/2021    CREATININE 0.89 10/04/2021     GLUCOSE 111 (H) 10/04/2021    CALCIUM 9.0 10/04/2021    PROT 6.8 10/04/2021    LABALBU 3.5 10/04/2021    BILITOT 0.8 10/04/2021    ALKPHOS 62 10/04/2021    AST 33 10/04/2021    ALT 48 10/04/2021    LABGLOM >60 10/04/2021    GFRAA 68 02/24/2020    AGRATIO 1.6 02/24/2020    GLOB 3.3 10/04/2021         No results found for: VALAC, VALP, CARB2  No results found for: LITHM  RADIOLOGY REPORTS:(reviewed/updated 10/04/2021)  @RISRSLTEXT @  @lasthcg @    PSYCHOSOCIAL HISTORY:  Daughter passed away three years ago      MENTAL STATUS EXAM:    General appearance:   well- groomed, psychomotor activity is slowed  Eye contact: poor  eye contact  Speech: slowed, soft, decreased output.   Affect : Depressed, decreased range  Mood: "I am alright "  Thought Process: Logical, goal directed  Perception: Denies AH or VH.   Thought Content: Denies SI or Plan  Insight: Partial  Judgement: Fair  Cognition: Intact grossly.     ASSESSMENT AND PLAN:  Kever  C Erdmann meets criteria for a diagnosis of Major depressive disorder, substance abuse      Patient appears drowsy throughout consult. According to patient this is something that has been going on for over a month. From a psychiatric standpoint, patient may be discharged to home when medically cleared.   Thank your your consult. Please feel free to consult [place] again as needed.
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
## 2021-10-04 NOTE — Progress Notes (Signed)
Pharmacy Medication Reconciliation     The patient was interviewed regarding current PTA medication list, use and drug allergies.     Allergy Update: Meloxicam    Recommendations/Findings:   The following amendments were made to the patient's active medication list on file at [facility]:   1) Additions:   Adderall  Flonase  Latuda    2) Deletions:   Imitrex    3) Changes:   N/a      Pertinent Findings:   N/a    PTA medication list was corrected to the following:     Prior to Admission Medications   Prescriptions Last Dose Informant Patient Reported? Taking?   amphetamine-dextroamphetamine (ADDERALL) 20 MG tablet   Yes Yes   Sig: Take 1 tablet by mouth 2 times daily. Max Daily Amount: 40 mg   buPROPion (WELLBUTRIN SR) 150 MG extended release tablet 10/03/2021  Yes No   Sig: ceived the following from [facility]: Outside name: buPROPion SR (WELLBUTRIN SR) 150 mg SR tablet   fluticasone (FLONASE) 50 MCG/ACT nasal spray   Yes Yes   Sig: 2 sprays by Each Nostril route daily   lurasidone (LATUDA) 40 MG TABS tablet   Yes Yes   Sig: Take 1 tablet by mouth Daily with supper   topiramate (TOPAMAX) 50 MG tablet 10/03/2021  Yes No   Sig: Take by mouth 2 times daily      Facility-Administered Medications: None        Thank you,  Jules Husbands, PharmD student

## 2021-10-04 NOTE — Progress Notes (Signed)
Hospitalist Progress Note    NAME:   Brittney Lawson   DOB: October 07, 1965   MRN: [id number]     Date/Time: 10/04/2021 10:31 AM  Patient PCP: Clinton Sawyer, MD    Estimated discharge date:8/1  Barriers: psych clearance       Assessment / Plan:  #Toxic metabolic encephalopathy - possibly in the background of medication over dose   Cocaine + - with drug use disorder   -patient clinically better , alert anda oriented today   -she did not remember the medications she took and the events around her passing out   -ct head negative , ekg - nsr   -need to confirm if poison control was called in the ed , if not would like for poison control to follow up   -if chest pain - will get trop I and ekg     #H/O Depression on medications   -will continue welbutrin and lurasidone   -psychiatry consulted  -patient mentioned she still felt depressed from passing away of her daughter - but was not suicidal now   -she was not suicidal when she was brought to the hospital as well   -will need to confirm home medications .     #Leucocytosis - less likely septic   - will trend in am labs     #H/O CAD in the past with cocaine use   -asymptomatic       Medical Decision Making:   I personally reviewed labs:cbc ,bmp   I personally reviewed imaging:ct ehad   I personally reviewed EKG:nsr , sinus brady   Toxic drug monitoring: needs poison control for possible medication over dose , mental status monitoring q shift   Discussed case with: patient     Code Status: full   DVT Prophylaxis: na   GI Prophylaxis:na     Subjective:     Chief Complaint / Reason for Physician Visit  Patient was admitted for a possible toxic metabolic encephalopathy- from a possible medication over dose . She feels fine today .     Labs and charts reviewed and patient examined .Over night events noted .       Objective:     VITALS:   Last 24hrs VS reviewed since prior progress note. Most recent are:  Patient Vitals for the past 24 hrs:   BP Temp Temp src Pulse Resp SpO2  Height Weight   10/04/21 0811 126/68 98.5 F (36.9 C) Oral 59 15 95 % -- --   10/04/21 0515 (!) 124/94 98.6 F (37 C) Oral 66 18 96 % 5\' 10"  (1.778 m) 134 lb 14.7 oz (61.2 kg)   10/04/21 0315 101/61 -- -- 73 17 -- -- --   10/04/21 0300 104/68 -- -- 75 15 -- -- --   10/04/21 0245 (!) 95/53 -- -- 66 18 -- -- --   10/04/21 0230 (!) 97/55 -- -- 63 15 -- -- --   10/04/21 0215 102/62 -- -- 74 15 -- -- --   10/04/21 0200 (!) 97/55 -- -- 65 17 91 % -- --   10/04/21 0000 127/79 -- -- 87 22 -- -- --   10/03/21 2345 124/83 -- -- 66 21 -- -- --   10/03/21 2330 114/78 -- -- 88 (!) 31 -- -- --   10/03/21 2315 133/82 -- -- 79 22 -- -- 134 lb 14.7 oz (61.2 kg)   10/03/21 2300 127/85 -- -- 86 18 -- -- --   10/03/21 2245  117/70 -- -- 71 16 90 % -- --   10/03/21 2230 101/70 -- -- 72 16 90 % -- --   10/03/21 2215 102/72 -- -- 73 16 -- -- --   10/03/21 2200 100/67 -- -- 76 18 90 % -- --   10/03/21 2130 115/71 -- -- 82 19 93 % -- --   10/03/21 2115 119/79 -- -- 81 20 (!) 82 % -- --   10/03/21 2100 (!) 101/58 -- -- 75 14 90 % -- --   10/03/21 2045 129/79 -- -- 94 29 95 % -- --   10/03/21 2041 130/76 98.5 F (36.9 C) Oral 76 17 95 % -- --       No intake or output data in the 24 hours ending 10/04/21 1031     I had a face to face encounter and independently examined this patient on 10/04/2021, as outlined below:  PHYSICAL EXAM:  General: Alert, cooperative  EENT:  EOMI. Anicteric sclerae.  Resp:  CTA bilaterally, no wheezing or rales.  No accessory muscle use  CV:  Regular  rhythm,  No edema  GI:  Soft, Non distended, Non tender.  +Bowel sounds  Neurologic:  Alert and oriented X 3, normal speech,   Psych:   Good insight. Not anxious nor agitated  Skin:  No rashes.  No jaundice    Reviewed most current lab test results and cultures  YES  Reviewed most current radiology test results   YES  Review and summation of old records today    NO  Reviewed patient's current orders and MAR    YES  PMH/SH reviewed - no change compared to  H&P  ________________________________________________________________________  Care Plan discussed with:    Comments   Patient x    Family      RN x    Care Manager     Consultant                        Multidiciplinary team rounds were held today with case manager, nursing, pharmacist and Higher education careers adviser.  Patient's plan of care was discussed; medications were reviewed and discharge planning was addressed.     ________________________________________________________________________  Total NON critical care TIME:  35  Minutes    Total CRITICAL CARE TIME Spent:   Minutes non procedure based      Comments   >50% of visit spent in counseling and coordination of care     ________________________________________________________________________  Earnstine Regal, MD     Procedures: see electronic medical records for all procedures/Xrays and details which were not copied into this note but were reviewed prior to creation of Plan.      LABS:  I reviewed today's most current labs and imaging studies.  Pertinent labs include:  Recent Labs     10/03/21  2120 10/04/21  0457   WBC 10.7 12.2*   HGB 12.4 13.0   HCT 35.4 39.0   PLT 231 238     Recent Labs     10/03/21  2120 10/04/21  0457   NA 140 142   K 3.5 4.4   CL 108 114*   CO2 26 24   GLUCOSE 114* 111*   BUN 33* 24*   CREATININE 1.02 0.89   CALCIUM 8.9 9.0   MG  --  2.3   LABALBU 3.5 3.5   BILITOT 0.2 0.8   AST 30 33   ALT 49 48  Signed: Jeannetta Nap, MD

## 2021-10-04 NOTE — Care Coordination-Inpatient (Addendum)
10/04/21 1451   Service Assessment   Patient Orientation Alert and Oriented   Cognition Alert   History Provided By Patient   Primary Caregiver Self   Support Systems Family Members   PCP Verified by CM Yes   Last Visit to PCP Within last 3 months   Prior Functional Level Independent in ADLs/IADLs   Current Functional Level Independent in ADLs/IADLs   Can patient return to prior living arrangement Yes   Ability to make needs known: Good   Family able to assist with home care needs: Yes   Would you like for me to discuss the discharge plan with any other family members/significant others, and if so, who? No   Social/Functional History   Lives With Alone   Type of [place] Two level   [place]antation to enter with rails   Receives Help From Family;Friend(s)   ADL Assistance Independent   Homemaking Assistance Independent   Ambulation Assistance Independent   Transfer Assistance Independent   Discharge Planning   Type of [place] Prior To Admission None   Type of [place] None   Patient expects to be discharged to: House     CM met with patient at bedside.  PCP and pharmacy verified.  Pt lives alone in 2 story home but only occupy's second story apartment, which has approx 10 steps to enter.  Pt Independent with ADL's prior to admission, Independent with ADL's at this time. No HH services prior to admission.  Pt has no DME.  Expects to return home on discharge unsure of transportation stating, she is not sure she can obtain a ride from family or friends.  Pt has no CM needs identified at this time.  CM will continue to follow for any needs that arise.

## 2021-10-04 NOTE — Plan of Care (Signed)
Problem: Discharge Planning  Goal: Discharge to home or other facility with appropriate resources  Outcome: Progressing  Flowsheets (Taken 10/04/2021 [phone])  Discharge to home or other facility with appropriate resources: Identify barriers to discharge with patient and caregiver     Problem: Confusion  Goal: Confusion, delirium, dementia, or psychosis is improved or at baseline  Description: INTERVENTIONS:  1. Assess for possible contributors to thought disturbance, including medications, impaired vision or hearing, underlying metabolic abnormalities, dehydration, psychiatric diagnoses, and notify attending LIP  2. Institute high risk fall precautions, as indicated  3. Provide frequent short contacts to provide reality reorientation, refocusing and direction  4. Decrease environmental stimuli, including noise as appropriate  5. Monitor and intervene to maintain adequate nutrition, hydration, elimination, sleep and activity  6. If unable to ensure safety without constant attention obtain sitter and review sitter guidelines with assigned personnel  7. Initiate Psychosocial CNS and Spiritual Care consult, as indicated  Outcome: Progressing  Flowsheets (Taken 10/04/2021 [phone])  Effect of thought disturbance (confusion, delirium, dementia, or psychosis) are managed with adequate functional status: Assess for contributors to thought disturbance, including medications, impaired vision or hearing, underlying metabolic abnormalities, dehydration, psychiatric diagnoses, notify LIP     Problem: ABCDS Injury Assessment  Goal: Absence of physical injury  Outcome: Progressing     Problem: Safety - Adult  Goal: Free from fall injury  Outcome: Progressing

## 2021-10-04 NOTE — Progress Notes (Addendum)
1104 - Delivered VOON to patient. See scanned documents. Thomes Lolling, Care Management Assistant    [phone] - Attempted to deliver and verbally explain the VOON with patient. Patient was resting. Thomes Lolling, Care Management Assistant v    Attempted to deliver and verbally explain the VOON with patient. Patient was resting. Thomes Lolling, Care Management Assistant

## 2021-10-05 LAB — EKG 12-LEAD
Atrial Rate: 49 {beats}/min
Atrial Rate: 57 {beats}/min
Atrial Rate: 59 {beats}/min
Atrial Rate: 61 {beats}/min
Atrial Rate: 75 {beats}/min
Diagnosis: NORMAL
P Axis: -6 degrees
P Axis: 21 degrees
P Axis: 34 degrees
P Axis: 44 degrees
P Axis: 59 degrees
P-R Interval: 110 ms
P-R Interval: 114 ms
P-R Interval: 118 ms
P-R Interval: 120 ms
P-R Interval: 124 ms
Q-T Interval: 406 ms
Q-T Interval: 412 ms
Q-T Interval: 412 ms
Q-T Interval: 416 ms
Q-T Interval: 446 ms
QRS Duration: 74 ms
QRS Duration: 74 ms
QRS Duration: 74 ms
QRS Duration: 74 ms
QRS Duration: 74 ms
QTc Calculation (Bazett): 375 ms
QTc Calculation (Bazett): 395 ms
QTc Calculation (Bazett): 414 ms
QTc Calculation (Bazett): 441 ms
QTc Calculation (Bazett): 460 ms
R Axis: -50 degrees
R Axis: -54 degrees
R Axis: -61 degrees
R Axis: -62 degrees
R Axis: -65 degrees
T Axis: 10 degrees
T Axis: 12 degrees
T Axis: 22 degrees
T Axis: 27 degrees
T Axis: 6 degrees
Ventricular Rate: 49 {beats}/min
Ventricular Rate: 57 {beats}/min
Ventricular Rate: 59 {beats}/min
Ventricular Rate: 61 {beats}/min
Ventricular Rate: 75 {beats}/min

## 2021-10-05 LAB — BASIC METABOLIC PANEL
Anion Gap: 1 mmol/L — ABNORMAL LOW (ref 5–15)
BUN: 18 MG/DL (ref 6–20)
Bun/Cre Ratio: 24 — ABNORMAL HIGH (ref 12–20)
CO2: 24 mmol/L (ref 21–32)
Calcium: 8.2 MG/DL — ABNORMAL LOW (ref 8.5–10.1)
Chloride: 119 mmol/L — ABNORMAL HIGH (ref 97–108)
Creatinine: 0.76 MG/DL (ref 0.55–1.02)
Est, Glom Filt Rate: >60 mL/min/{1.73_m2} (ref >60)
Glucose: 113 mg/dL — ABNORMAL HIGH (ref 65–100)
Potassium: 4 mmol/L (ref 3.5–5.1)
Sodium: 144 mmol/L (ref 136–145)

## 2021-10-05 MED FILL — ENOXAPARIN SODIUM 40 MG/0.4ML IJ SOSY: 40 MG/0.4ML | INTRAMUSCULAR | Qty: 0.4

## 2021-10-05 MED FILL — LATUDA 40 MG PO TABS: 40 MG | ORAL | Qty: 1

## 2021-10-05 NOTE — Progress Notes (Signed)
Pharmacist Discharge Medication Reconciliation    Significant PMH:   Past Medical History:   Diagnosis Date    Alcohol abuse     Depression     Headache     MI (myocardial infarction) (HCC)     x 4; no interventions       Admitting Diagnoses: Toxic encephalopathy [G92.9]  Altered mental status [R41.82]  Polypharmacy [Z79.899]    Allergies: Meloxicam    Admission date: 10/03/2021  8:37 PM    Current Discharge Medication List        CONTINUE these medications which have NOT CHANGED    Details   amphetamine-dextroamphetamine (ADDERALL) 20 MG tablet Take 1 tablet by mouth 2 times daily. Max Daily Amount: 40 mg      fluticasone (FLONASE) 50 MCG/ACT nasal spray 2 sprays by Each Nostril route daily      lurasidone (LATUDA) 40 MG TABS tablet Take 1 tablet by mouth Daily with supper      buPROPion (WELLBUTRIN SR) 150 MG extended release tablet ceived the following from [facility]: Outside name: buPROPion SR (WELLBUTRIN SR) 150 mg SR tablet      topiramate (TOPAMAX) 50 MG tablet Take by mouth 2 times daily            The patient's chart, MAR and AVS were reviewed by Hewitt Blade, RPH.    Discharging Provider: Earnstine Regal, MD     Thank you,     Hewitt Blade, [facility]

## 2021-10-05 NOTE — Discharge Summary (Addendum)
Discharge Summary    Name: Brittney Lawson  [id number]  Date of Birth: 1965-05-17 (Age: 56 y.o.)   Date of Admission: 10/03/2021  Date of Discharge: 10/05/2021  Attending Physician: No att. providers found    Discharge Diagnosis:       Consultations:  IP CONSULT TO PSYCHIATRY  IP CONSULT TO PHARMACY      Brief Admission History/Reason for Admission Per Ahmed Ellie Lunch, MD:   Brittney Lawson is a 56 y.o.  female with PMHx significant for multiple comorbidities presents to [facility] with altered mental status.  Of note patient was found slumped over in her car with multiple pill bottles, and was brought in by law enforcement to the ED.  Of note patient is currently somnolent, unable to obtain history review of systems secondary to patient's clinical status.  Patient was not suicidal when the event happened.          Brief Hospital Course by Main Problems:     #Toxic metabolic encephalopathy -  in the background of medication over dose   Cocaine + - with drug use disorder   -patient clinically better   -she did not remember the medications she took and the events around her passing out   -ct head negative , ekg - nsr   -poison control alerted   -psych involved in care - no active intervention from them   -patient would need to set up a psychiatrist as outpatient     #H/O Depression on medications   -continued welbutrin and lurasidone and her home medications   -psychiatry consulted and their recs followed   -patient mentioned she still felt depressed from passing away of her daughter - but was not suicidal now   -she was not suicidal when she was brought to the hospital as well      #Leucocytosis - stress related   - resolved     #H/O CAD in the past with cocaine use   -asymptomatic        Discharge Exam:  Patient seen and examined by me on discharge day.  Pertinent Findings:  Patient Vitals for the past 24 hrs:   BP Temp Temp src Pulse Resp SpO2    10/05/21 1158 128/84 98.7 F (37.1 C) Oral 85 25 98 %   10/05/21 0754 120/87 98.6 F (37 C) Oral 76 18 97 %   10/05/21 0403 122/87 97.9 F (36.6 C) Oral 82 22 100 %   10/04/21 2354 (!) 103/54 98.4 F (36.9 C) Oral 76 20 96 %   10/04/21 2020 122/79 97.5 F (36.4 C) Oral 70 16 94 %       General:          Alert, cooperative  EENT:              EOMI. Anicteric sclerae.  Resp:               CTA bilaterally, no wheezing or rales.  No accessory muscle use  CV:                  Regular  rhythm,  No edema  GI:                   Soft, Non distended, Non tender.  +Bowel sounds  Neurologic:       Alert and oriented X 3, normal speech,   Psych:   Good insight. Not anxious nor agitated  Skin:  No rashes.  No jaundice    Discharge/Recent Laboratory Results:  Recent Labs     10/04/21  0457 10/05/21  0554   NA 142 144   K 4.4 4.0   CL 114* 119*   CO2 24 24   BUN 24* 18   CREATININE 0.89 0.76   GLUCOSE 111* 113*   CALCIUM 9.0 8.2*   MG 2.3  --      Recent Labs     10/04/21  0457   HGB 13.0   HCT 39.0   WBC 12.2*   PLT 238       Discharge Medications:     Medication List        CONTINUE taking these medications      amphetamine-dextroamphetamine 20 MG tablet  Commonly known as: ADDERALL     buPROPion 150 MG extended release tablet  Commonly known as: WELLBUTRIN SR     fluticasone 50 MCG/ACT nasal spray  Commonly known as: FLONASE     lurasidone 40 MG Tabs tablet  Commonly known as: LATUDA     topiramate 50 MG tablet  Commonly known as: TOPAMAX            STOP taking these medications      SUMAtriptan 20 MG/ACT nasal spray  Commonly known as: IMITREX                  DISPOSITION:    Home with Family: x      Home with HH/PT/OT/RN:    SNF/LTC:    SAHR:    OTHER:            Code status: full  Recommended diet: regular diet  Recommended activity: activity as tolerated  Wound care: None      Follow up with:   PCP : Clinton Sawyer, MD  [facility]    Follow up  Please contact Access Services Call Center  at [phone] for more information.    [facility] Department of Social Services  [address] [place], [place] [address]  Phone: [phone]  Follow up  Please contact for further assistance with social services.    [name], MD  [address] [place]  [place] [address]  [phone]    Schedule an appointment as soon as possible for a visit in 1 week(s)  To schedule your PCP hospital follow up.          Total time in minutes spent coordinating this discharge (includes going over instructions, follow-up, prescriptions, and preparing report for sign off to her PCP) :  35 minutes

## 2021-10-05 NOTE — Progress Notes (Signed)
Attempted to schedule PCP hospital follow up appointment. Unable to reach anyone, unable to leave voicemail.  Pending patient discharge. Landan Fedie, Care Management Assistant

## 2021-10-05 NOTE — Plan of Care (Signed)
Problem: Discharge Planning  Goal: Discharge to home or other facility with appropriate resources  Outcome: Adequate for Discharge     Problem: Confusion  Goal: Confusion, delirium, dementia, or psychosis is improved or at baseline  Description: INTERVENTIONS:  1. Assess for possible contributors to thought disturbance, including medications, impaired vision or hearing, underlying metabolic abnormalities, dehydration, psychiatric diagnoses, and notify attending LIP  2. Institute high risk fall precautions, as indicated  3. Provide frequent short contacts to provide reality reorientation, refocusing and direction  4. Decrease environmental stimuli, including noise as appropriate  5. Monitor and intervene to maintain adequate nutrition, hydration, elimination, sleep and activity  6. If unable to ensure safety without constant attention obtain sitter and review sitter guidelines with assigned personnel  7. Initiate Psychosocial CNS and Spiritual Care consult, as indicated  Outcome: Adequate for Discharge     Problem: ABCDS Injury Assessment  Goal: Absence of physical injury  Outcome: Adequate for Discharge     Problem: Safety - Adult  Goal: Free from fall injury  Outcome: Adequate for Discharge

## 2021-10-05 NOTE — Care Coordination-Inpatient (Signed)
Patient is clear from a CM standpoint.    Transition of Care Plan: Home with follow ups.  Psych resources listed on AVS and patient aware of this.     RUR: N/A Obs.  Prior Level of Functioning: Independent.  Disposition: Home with follow ups.  Psych resources listed on AVS and patient aware of this.   If SNF or IPR: Date FOC offered: N/A  Date FOC received: N/A  Accepting facility: N/A  Date authorization started with reference number: N/A  Date authorization received and expires: N/A  Follow up appointments: PCP/specialists if needed.  DME needed: None.  Transportation at discharge: Clearence Cheek Britta Mccreedy)  IM/IMM Medicare/Tricare letter given: N/A Commercial. VOON previously provided.  Is patient a Veteran and connected with VA? No.   If yes, was [employer] transfer form completed and VA notified? N/A  Caregiver Contact: Berta Minor - other - [phone]  Discharge Caregiver contacted prior to discharge? Patient to contact.  Care Conference needed? No.  Barriers to discharge: None.    RN notified that patient is clear from a CM standpoint.     10/05/21 1419   Services At/After Discharge   Transition of Care Consult (CM Consult) Other  (OP psych resources provided on AVS)   Services At/After Discharge Outpatient   Mode of Transport at Discharge Other (see comment)  (Friend)   Confirm Follow Up Transport Self         Ronnell Freshwater, BSN, RN    Care Management  [phone]

## 2021-10-05 NOTE — Progress Notes (Addendum)
Bedside shift report received from [place], Charity fundraiser. Report included the following information SBAR, Kardex, MAR, and Recent Results. This RN verbalized understanding of plan of care with opportunity for clarification and questions. Care of patient assumed at this time.    Patient educated on discharge education and instructions including when to notify provider; importance of follow up appointment; and medication adherence. Discharge instructions explained to patient. Patient verbalized understanding of discharge paperwork with all questions/concerns addressed. Patient  And RN signed discharge paperwork. Patient with discharge paperwork in hand. Patient discharged at this time.

## 2021-10-06 LAB — EKG 12-LEAD
Atrial Rate: 62 {beats}/min
Diagnosis: NORMAL
P Axis: 54 degrees
P-R Interval: 118 ms
Q-T Interval: 438 ms
QRS Duration: 66 ms
QTc Calculation (Bazett): 444 ms
R Axis: -57 degrees
T Axis: 22 degrees
Ventricular Rate: 62 {beats}/min

## 2021-10-08 NOTE — Telephone Encounter (Signed)
Care Transitions Initial Follow Up Call    Outreach made within 2 business days of discharge: Yes    Patient: Brittney Lawson Patient DOB: 06-15-1965   MRN: [id number]  Reason for Admission: There are no discharge diagnoses documented for the most recent discharge.  Discharge Date: 10/05/21       Spoke with: Patient    Discharge department/facility: [facility]    TCM Interactive Patient Contact:  Was patient able to fill all prescriptions: Yes  Was patient instructed to bring all medications to the follow-up visit: Yes  Is patient taking all medications as directed in the discharge summary? Yes  Does patient understand their discharge instructions: Yes  Does patient have questions or concerns that need addressed prior to 7-14 day follow up office visit: no    Scheduled appointment with PCP within 7-14 days    Follow Up  Future Appointments   Date Time Provider Department Center   10/11/2021 10:30 AM Rishi Tomma Lightning, MD [facility] MAIN BS AMB       Zameria Vogl, LPN

## 2021-10-11 ENCOUNTER — Ambulatory Visit: Admit: 2021-10-11 | Discharge: 2021-10-11 | Payer: MEDICAID | Attending: Family Medicine | Primary: Family Medicine

## 2021-10-11 DIAGNOSIS — Z79899 Other long term (current) drug therapy: Secondary | ICD-10-CM

## 2021-10-11 NOTE — Progress Notes (Signed)
Chief Complaint   Patient presents with    Follow-Up from Hospital     Patient is here for a follow up      she is a 56 y.o. year old female who presents for follow-up of the hospital follow up.   Patient states that she was hospitalized for toxic encephalopathy and polypharmacy use from July 30 to August 1.  Patient states that she was driving through a neighborhood in [place] and states that she was found by the police slumped over asleep.  They thought that she has been abusing her medications.  They monitored her in the hospital with no withdrawals for 2 days.  States that a few days earlier she did do a line of cocaine and this is why her urine test was positive..  Reviewed and agree with Nurse Note and duplicated in this note.  Reviewed PmHx, RxHx, FmHx, SocHx, AllgHx and updated and dated in the chart.    No family history on file.    Past Medical History:   Diagnosis Date    Alcohol abuse     Depression     Headache     MI (myocardial infarction) (HCC)     x 4; no interventions      Social History     Socioeconomic History    Marital status: Legally Separated   Tobacco Use    Smoking status: Former     Packs/day: 0.25     Types: Cigarettes    Smokeless tobacco: Never   Vaping Use    Vaping Use: Every day   Substance and Sexual Activity    Alcohol use: Not Currently     Alcohol/week: 4.0 standard drinks    Drug use: Yes     Types: Cocaine        Review of Systems - negative except as listed above      Objective:     Vitals:    10/11/21 1107   BP: 126/84   Pulse: 90   Resp: 16   Temp: 98.2 F (36.8 C)   SpO2: 92%   Weight: 135 lb (61.2 kg)   Height: 5\' 10"  (1.778 m)       Physical Examination: General appearance - alert, well appearing, and in no distress  Eyes - pupils equal and reactive, extraocular eye movements intact  Ears - bilateral TM's and external ear canals normal  Nose - normal and patent, no erythema, discharge or polyps  Mouth - mucous membranes moist, pharynx normal without lesions  Neck -  supple, no significant adenopathy  Chest - clear to auscultation, no wheezes, rales or rhonchi, symmetric air entry  Heart - normal rate, regular rhythm, normal S1, S2, no murmurs, rubs, clicks or gallops  Abdomen - soft, nontender, nondistended, no masses or organomegaly  Neurological - alert, oriented, normal speech, no focal findings or movement disorder noted  Musculoskeletal - no joint tenderness, deformity or swelling  Extremities - peripheral pulses normal, no pedal edema, no clubbing or cyanosis  Skin - normal coloration and turgor, no rashes, no suspicious skin lesions noted     Assessment/ Plan:   1. Polypharmacy  2. Toxic encephalopathy  Resolved and no further recurrences  3. Elevated blood sugar  -     Hemoglobin A1C; Future  4. Recurrent major depressive disorder, in partial remission (HCC)  Continue follow-up with psychiatry  Return if symptoms worsen or fail to improve.         I have discussed the  diagnosis with the patient and the intended plan as seen in the above orders.  The patient has received an after-visit summary and questions were answered concerning future plans.     Medication Side Effects and Warnings were discussed with patient,  Patient Labs were reviewed and or requested, and  Patient Past Records were reviewed and or requested  yes       Pt agrees to call or return to clinic and/or go to closest ER with any worsening of symptoms.  This may include, but not limited to increased fever (>100.4) with NSAIDS or Tylenol, increased edema, confusion, rash, worsening of presenting symptoms.    Please note that this dictation was completed with Dragon, the computer voice recognition software.  Quite often unanticipated grammatical, syntax, homophones, and other interpretive errors are inadvertently transcribed by the computer software.  Please disregard these errors.  Please excuse any errors that have escaped final proofreading.  Thank you.

## 2022-01-03 ENCOUNTER — Other Ambulatory Visit: Payer: Self-pay

## 2022-01-03 MED ORDER — BUPROPION HCL ER (XL) 300 MG PO TB24
300.0000 mg | ORAL_TABLET | Freq: Every morning | ORAL | 1 refills | Status: DC
  Filled 2022-01-03: qty 30, 30d supply, fill #0
  Filled 2022-04-05: qty 30, 30d supply, fill #1

## 2022-01-03 MED ORDER — LURASIDONE HCL 20 MG PO TABS
20.0000 mg | ORAL_TABLET | Freq: Every day | ORAL | 1 refills | Status: AC
  Filled 2022-01-03: qty 30, 30d supply, fill #0

## 2022-01-07 ENCOUNTER — Other Ambulatory Visit: Payer: Self-pay

## 2022-01-25 ENCOUNTER — Other Ambulatory Visit: Payer: Self-pay

## 2022-02-03 ENCOUNTER — Other Ambulatory Visit: Payer: Self-pay

## 2022-02-03 MED ORDER — BUPROPION HCL ER (XL) 300 MG PO TB24
300.0000 mg | ORAL_TABLET | Freq: Every morning | ORAL | 0 refills | Status: AC
  Filled 2022-02-03: qty 30, 30d supply, fill #0

## 2022-02-03 MED ORDER — LURASIDONE HCL 40 MG PO TABS
40.0000 mg | ORAL_TABLET | Freq: Every day | ORAL | 1 refills | Status: DC
  Filled 2022-02-03: qty 30, 30d supply, fill #0
  Filled 2022-03-03 (×2): qty 30, 30d supply, fill #1

## 2022-02-04 ENCOUNTER — Other Ambulatory Visit: Payer: Self-pay

## 2022-02-23 ENCOUNTER — Other Ambulatory Visit: Payer: Self-pay

## 2022-02-23 MED ORDER — AMOXICILLIN-POT CLAVULANATE 875-125 MG PO TABS
ORAL_TABLET | ORAL | 0 refills | Status: AC
  Filled 2022-02-23: qty 20, 10d supply, fill #0

## 2022-02-23 MED ORDER — METHYLPREDNISOLONE 4 MG PO TBPK
ORAL_TABLET | ORAL | 0 refills | Status: AC
  Filled 2022-02-23: qty 21, 6d supply, fill #0

## 2022-02-23 NOTE — ED Provider Notes (Signed)
Formatting of this note is different from the original.  Brittney Lawson  Emergency Department Provider Note     ED Clinical Impression     Final diagnoses:   Acute cough (Primary)   Upper respiratory tract infection, unspecified type         Impression, Medical Decision Making, Progress Notes and Critical Care     Impression, Differential Diagnosis and Plan of Brittney Lawson is a 56 y.o. female presenting with facial pressure, dental pain, and cough for greater than 1 week now.  Has remained afebrile.    On exam patient is well appearing and in NAD. Patient's vital signs showing normal blood pressure, heart rate within normal limits, afebrile, saturating 98% on room air with no signs of any respiratory distress. Exam is remarkable for bilateral upper apical wheezing with tenderness to palpation in the maxillary and frontal sinuses.  Patient meets IDSA criteria for bacterial sinusitis.  Will treat with Augmentin and add methylprednisolone.    Portions of this record have been created using [employer]. Dictation errors have been sought, but may not have been identified and corrected.    See chart and resident provider documentation for details.    ____________________________________________       History       Reason for Visit  Cough    HPI   Brittney Lawson is a 56 y.o. female with a past medical history of ADHD, alcoholism, anxiety, chronic pain disorder, liver disease, who presents to the emergency department for evaluation of facial pain and cough.    Past Medical History:   Diagnosis Date    ADHD (attention deficit hyperactivity disorder)     Alcoholism (CMS-HCC)     Anxiety     Borderline personality disorder (CMS-HCC) 09/05/2016    Chronic pain disorder     Cocaine abuse (CMS-HCC)     Depression     Liver disease     Panic disorder     Psychiatric disorder      Patient Active Problem List   Diagnosis    Suicidal ideation    Unspecified Depressive  Disorder    Moderate malnutrition (CMS-HCC)    Polysubstance dependence including opioid type drug, continuous use (CMS-HCC)    Opiate Use Disorder, moderate    Cocaine Use Disorder, severe    Borderline personality disorder (CMS-HCC)     No past surgical history on file.    No current facility-administered medications for this encounter.    Current Outpatient Medications:     acetaminophen (TYLENOL) 325 MG tablet, Take 2 tablets (650 mg total) by mouth every six (6) hours as needed., Disp: 60 tablet, Rfl: 0    amoxicillin-clavulanate (AUGMENTIN) 875-125 mg per tablet, Take 1 tablet by mouth two (2) times a day for 10 days., Disp: 20 tablet, Rfl: 0    fexofenadine (ALLEGRA) 180 MG tablet, Take 180 mg by mouth daily., Disp: , Rfl:     fluticasone (FLONASE) 50 mcg/actuation nasal spray, 1 spray by Each Nare route daily., Disp: , Rfl:     methylPREDNISolone (MEDROL DOSEPACK) 4 mg tablet, follow package directions, Disp: 1 each, Rfl: 0    nicotine (NICODERM CQ) 21 mg/24 hr patch, Place 1 patch on the skin daily., Disp: 28 patch, Rfl: 0    sertraline (ZOLOFT) 50 MG tablet, Take 1 tablet (50 mg total) by mouth daily., Disp: 30 tablet, Rfl: 0    traZODone (DESYREL) 100 MG tablet, Take  1 tablet (100 mg total) by mouth nightly., Disp: 180 tablet, Rfl: 3    Allergies  Patient has no known allergies.    Family History   Problem Relation Age of Onset    Heart attack Father 28        triple bypass age 3, multiple bypasses    Heart attack Brother 68    Heart attack Other 63        nephew died MI 53yo     Social History  Social History     Tobacco Use    Smoking status: Every Day     Current packs/day: 1.00     Average packs/day: 1 pack/day for 12.0 years (12.0 ttl pk-yrs)     Types: Cigarettes    Smokeless tobacco: Never   Substance Use Topics    Alcohol use: Yes     Alcohol/week: 71.0 standard drinks of alcohol     Types: 40 Cans of beer, 30 Shots of liquor, 1 Standard drinks or equivalent per week     Comment: history of  severe alcohol abuse, has 6 DUIs    Drug use: No     Types: Cocaine, Heroin, IV      Physical Exam     This provider entered the patient's room: No:    If this provider did not enter the room, a comprehensive physical exam was not able to be performed due to increased infection risk to themselves, other providers, staff and other patients), as well as to conserve personal protective equipment (PPE) utilization during the COVID-19 pandemic.    If this provider did enter the patient room, the following was PPE worn: N/A     ED Triage Vitals [02/23/22 1226]   Enc Vitals Group      BP 137/94      Heart Rate 72      SpO2 Pulse       Resp 18      Temp 36.8 C (98.2 F)      Temp Source Oral      SpO2 98 %      Weight 70.3 kg (155 lb)      Height       Head Circumference       Peak Flow       Pain Score       Pain Loc       Pain Edu?       Excl. in GC?      Constitutional: Alert and oriented. Well appearing and in no distress.  Eyes: Conjunctivae are normal.  ENT       Head: Maxillary sinus tenderness to palpation       Nose: No congestion.       Mouth/Throat: Mucous membranes are moist.       Neck: No stridor.  Hematological/Lymphatic/Immunilogical: No cervical lymphadenopathy.  Cardiovascular: Normal rate, regular rhythm. Normal and symmetric distal pulses are present in all extremities.  Respiratory: Bilateral upper apical wheezing   gastrointestinal: Soft and nontender. There is no CVA tenderness.    Musculoskeletal: Normal range of motion in all extremities.       Right lower leg: No tenderness or edema.       Left lower leg: No tenderness or edema.  Neurologic: Normal speech and language. No gross focal neurologic deficits are appreciated.  Skin: Skin is warm, dry and intact. No rash noted.  Psychiatric: Mood and affect are normal. Speech and behavior are normal.  Radiology     No orders to display      Procedures         Loman Brooklyn, FNP  02/23/22 1256    Electronically signed by Loman Brooklyn, FNP at 02/23/2022 12:56 PM EST

## 2022-02-23 NOTE — Unmapped (Signed)
Formatting of this note might be different from the original.  Patient presents here with c/o cough and vomiting since this am. Reports that she lives at a recovery house and someone was tested positive for COVID+/ h/o sinus issues   Electronically signed by Lalla Brothers, RN at 02/23/2022 12:31 PM EST

## 2022-03-02 ENCOUNTER — Other Ambulatory Visit: Payer: Self-pay

## 2022-03-02 MED ORDER — BUPROPION HCL ER (XL) 300 MG PO TB24
300.0000 mg | ORAL_TABLET | Freq: Every morning | ORAL | 0 refills | Status: AC
  Filled 2022-03-02: qty 30, 30d supply, fill #0

## 2022-03-03 ENCOUNTER — Other Ambulatory Visit: Payer: Self-pay

## 2022-03-29 ENCOUNTER — Other Ambulatory Visit: Payer: Self-pay

## 2022-03-29 MED ORDER — TOPIRAMATE 50 MG PO TABS
50.0000 mg | ORAL_TABLET | Freq: Two times a day (BID) | ORAL | 0 refills | Status: AC
  Filled 2022-03-29: qty 180, 90d supply, fill #0

## 2022-04-05 ENCOUNTER — Other Ambulatory Visit: Payer: Self-pay

## 2022-04-08 ENCOUNTER — Other Ambulatory Visit: Payer: Self-pay

## 2022-04-08 MED ORDER — BUPROPION HCL ER (XL) 300 MG PO TB24
300.0000 mg | ORAL_TABLET | Freq: Every morning | ORAL | 0 refills | Status: AC
  Filled 2022-04-08: qty 7, 7d supply, fill #0

## 2022-04-08 MED ORDER — LURASIDONE HCL 40 MG PO TABS
40.0000 mg | ORAL_TABLET | Freq: Every day | ORAL | 0 refills | Status: AC
  Filled 2022-04-08: qty 7, 7d supply, fill #0

## 2022-04-11 ENCOUNTER — Other Ambulatory Visit: Payer: Self-pay

## 2022-04-11 MED ORDER — BUPROPION HCL ER (XL) 300 MG PO TB24
300.0000 mg | ORAL_TABLET | Freq: Every morning | ORAL | 1 refills | Status: AC
  Filled 2022-04-11: qty 30, 30d supply, fill #0

## 2022-04-11 MED ORDER — ARIPIPRAZOLE 5 MG PO TABS
5.0000 mg | ORAL_TABLET | Freq: Every day | ORAL | 1 refills | Status: AC
  Filled 2022-04-11: qty 30, 30d supply, fill #0

## 2022-07-20 ENCOUNTER — Other Ambulatory Visit: Payer: Self-pay
